# Patient Record
Sex: Female | Born: 1940 | Race: White | Hispanic: No | Marital: Married | Smoking: Never smoker
Health system: Southern US, Community
[De-identification: ages and names within clinical notes are randomized; demographics above are authoritative.]

## PROBLEM LIST (undated history)

## (undated) DIAGNOSIS — H409 Unspecified glaucoma: Secondary | ICD-10-CM

## (undated) DIAGNOSIS — E785 Hyperlipidemia, unspecified: Secondary | ICD-10-CM

## (undated) DIAGNOSIS — I10 Essential (primary) hypertension: Secondary | ICD-10-CM

## (undated) HISTORY — PX: CARPAL TUNNEL RELEASE: SHX101

## (undated) HISTORY — DX: Unspecified glaucoma: H40.9

## (undated) HISTORY — PX: REFRACTIVE SURGERY: SHX103

## (undated) HISTORY — PX: BUNIONECTOMY: SHX129

## (undated) HISTORY — PX: TONSILLECTOMY: SUR1361

## (undated) HISTORY — DX: Hyperlipidemia, unspecified: E78.5

## (undated) HISTORY — DX: Essential (primary) hypertension: I10

## (undated) HISTORY — PX: VARICOSE VEIN SURGERY: SHX832

---

## 1999-07-22 ENCOUNTER — Encounter: Admission: RE | Admit: 1999-07-22 | Discharge: 1999-07-22 | Payer: Self-pay | Admitting: Family Medicine

## 1999-07-22 ENCOUNTER — Encounter: Payer: Self-pay | Admitting: Family Medicine

## 2000-07-24 ENCOUNTER — Encounter: Admission: RE | Admit: 2000-07-24 | Discharge: 2000-07-24 | Payer: Self-pay | Admitting: Family Medicine

## 2000-07-24 ENCOUNTER — Encounter: Payer: Self-pay | Admitting: Family Medicine

## 2001-07-27 ENCOUNTER — Encounter: Payer: Self-pay | Admitting: Family Medicine

## 2001-07-27 ENCOUNTER — Encounter: Admission: RE | Admit: 2001-07-27 | Discharge: 2001-07-27 | Payer: Self-pay | Admitting: Family Medicine

## 2002-07-30 ENCOUNTER — Encounter: Admission: RE | Admit: 2002-07-30 | Discharge: 2002-07-30 | Payer: Self-pay | Admitting: Family Medicine

## 2002-07-30 ENCOUNTER — Encounter: Payer: Self-pay | Admitting: Family Medicine

## 2002-10-16 ENCOUNTER — Ambulatory Visit (HOSPITAL_BASED_OUTPATIENT_CLINIC_OR_DEPARTMENT_OTHER): Admission: RE | Admit: 2002-10-16 | Discharge: 2002-10-16 | Payer: Self-pay | Admitting: *Deleted

## 2002-12-18 ENCOUNTER — Ambulatory Visit (HOSPITAL_BASED_OUTPATIENT_CLINIC_OR_DEPARTMENT_OTHER): Admission: RE | Admit: 2002-12-18 | Discharge: 2002-12-18 | Payer: Self-pay | Admitting: *Deleted

## 2003-08-12 ENCOUNTER — Encounter: Admission: RE | Admit: 2003-08-12 | Discharge: 2003-08-12 | Payer: Self-pay | Admitting: Family Medicine

## 2003-09-11 ENCOUNTER — Ambulatory Visit (HOSPITAL_COMMUNITY): Admission: RE | Admit: 2003-09-11 | Discharge: 2003-09-11 | Payer: Self-pay | Admitting: Orthopedic Surgery

## 2003-09-11 ENCOUNTER — Ambulatory Visit (HOSPITAL_BASED_OUTPATIENT_CLINIC_OR_DEPARTMENT_OTHER): Admission: RE | Admit: 2003-09-11 | Discharge: 2003-09-11 | Payer: Self-pay | Admitting: Orthopedic Surgery

## 2004-08-12 ENCOUNTER — Encounter: Admission: RE | Admit: 2004-08-12 | Discharge: 2004-08-12 | Payer: Self-pay | Admitting: Family Medicine

## 2005-05-31 ENCOUNTER — Ambulatory Visit: Payer: Self-pay | Admitting: Internal Medicine

## 2005-06-22 ENCOUNTER — Ambulatory Visit: Payer: Self-pay | Admitting: Internal Medicine

## 2005-08-15 ENCOUNTER — Encounter: Admission: RE | Admit: 2005-08-15 | Discharge: 2005-08-15 | Payer: Self-pay | Admitting: Family Medicine

## 2005-09-29 ENCOUNTER — Encounter: Admission: RE | Admit: 2005-09-29 | Discharge: 2005-09-29 | Payer: Self-pay | Admitting: Family Medicine

## 2006-08-18 ENCOUNTER — Encounter: Admission: RE | Admit: 2006-08-18 | Discharge: 2006-08-18 | Payer: Self-pay | Admitting: Family Medicine

## 2006-08-29 ENCOUNTER — Encounter: Admission: RE | Admit: 2006-08-29 | Discharge: 2006-08-29 | Payer: Self-pay | Admitting: Family Medicine

## 2007-08-20 ENCOUNTER — Encounter: Admission: RE | Admit: 2007-08-20 | Discharge: 2007-08-20 | Payer: Self-pay | Admitting: Family Medicine

## 2008-04-22 ENCOUNTER — Encounter: Admission: RE | Admit: 2008-04-22 | Discharge: 2008-04-22 | Payer: Self-pay | Admitting: Family Medicine

## 2008-07-22 ENCOUNTER — Encounter: Admission: RE | Admit: 2008-07-22 | Discharge: 2008-07-22 | Payer: Self-pay | Admitting: Interventional Radiology

## 2008-08-26 ENCOUNTER — Encounter: Admission: RE | Admit: 2008-08-26 | Discharge: 2008-08-26 | Payer: Self-pay | Admitting: Internal Medicine

## 2008-09-10 ENCOUNTER — Encounter: Admission: RE | Admit: 2008-09-10 | Discharge: 2008-09-10 | Payer: Self-pay | Admitting: Interventional Radiology

## 2008-09-17 ENCOUNTER — Encounter: Admission: RE | Admit: 2008-09-17 | Discharge: 2008-09-17 | Payer: Self-pay | Admitting: Interventional Radiology

## 2008-10-07 ENCOUNTER — Encounter: Payer: Self-pay | Admitting: Interventional Radiology

## 2009-03-04 ENCOUNTER — Encounter: Admission: RE | Admit: 2009-03-04 | Discharge: 2009-03-04 | Payer: Self-pay | Admitting: Interventional Radiology

## 2009-08-28 ENCOUNTER — Encounter: Admission: RE | Admit: 2009-08-28 | Discharge: 2009-08-28 | Payer: Self-pay | Admitting: Internal Medicine

## 2010-06-01 ENCOUNTER — Encounter: Payer: Self-pay | Admitting: Internal Medicine

## 2010-06-10 ENCOUNTER — Encounter (INDEPENDENT_AMBULATORY_CARE_PROVIDER_SITE_OTHER): Payer: Self-pay | Admitting: *Deleted

## 2010-06-13 ENCOUNTER — Encounter: Payer: Self-pay | Admitting: Family Medicine

## 2010-06-24 NOTE — Letter (Signed)
Summary: Pre Visit Letter Revised  [facility]  [address] [place], [place] [address]   Phone: [phone]  Fax: [phone]        06/10/2010 MRN: [id number] Sheena Davis [address] [place], [place]  [address]             Procedure Date:  07-27-10   Welcome to the Gastroenterology Division at [facility].    You are scheduled to see a nurse for your pre-procedure visit on 07-13-10 at 10:30a.m. on the 3rd floor at [facility], [address]  We ask that you try to arrive at our office 15 minutes prior to your appointment time to allow for check-in.  Please take a minute to review the attached form.  If you answer "Yes" to one or more of the questions on the first page, we ask that you call the person listed at your earliest opportunity.  If you answer "No" to all of the questions, please complete the rest of the form and bring it to your appointment.    Your nurse visit will consist of discussing your medical and surgical history, your immediate family medical history, and your medications.   If you are unable to list all of your medications on the form, please bring the medication bottles to your appointment and we will list them.  We will need to be aware of both prescribed and over the counter drugs.  We will need to know exact dosage information as well.    Please be prepared to read and sign documents such as consent forms, a financial agreement, and acknowledgement forms.  If necessary, and with your consent, a friend or relative is welcome to sit-in on the nurse visit with you.  Please bring your insurance card so that we may make a copy of it.  If your insurance requires a referral to see a specialist, please bring your referral form from your primary care physician.  No co-pay is required for this nurse visit.     If you cannot keep your appointment, please call [phone] to cancel or reschedule prior to your appointment date.   This allows [place] the opportunity to schedule an appointment for another patient in need of care.    Thank you for choosing [facility] for your medical needs.  We appreciate the opportunity to care for you.  Please visit [place] at our website  to learn more about our practice.  Sincerely, The Gastroenterology Division

## 2010-06-24 NOTE — Letter (Signed)
Summary: Colonoscopy Letter  [facility]  [address] [place], [place] [address]   Phone: [phone]  Fax: [phone]      June 01, 2010 MRN: [id number]   [facility] [address] [place], [place]  [address]   Dear Ms. Cefalu,   According to your medical record, it is time for you to schedule a Colonoscopy. The American Cancer Society recommends this procedure as a method to detect early colon cancer. Patients with a family history of colon cancer, or a personal history of colon polyps or inflammatory bowel disease are at increased risk.  This letter has been generated based on the recommendations made at the time of your procedure. If you feel that in your particular situation this may no longer apply, please contact our office.  Please call our office at [phone] to schedule this appointment or to update your records at your earliest convenience.  Thank you for cooperating with [place] to provide you with the very best care possible.   Sincerely,  Hedwig Morton. Juanda Chance, M.D.  [facility] Gastroenterology Division [phone]

## 2010-07-12 ENCOUNTER — Encounter (INDEPENDENT_AMBULATORY_CARE_PROVIDER_SITE_OTHER): Payer: Self-pay

## 2010-07-13 ENCOUNTER — Encounter: Payer: Self-pay | Admitting: Internal Medicine

## 2010-07-20 NOTE — Miscellaneous (Signed)
Summary: Lec previsit  Clinical Lists Changes  Allergies: Added new allergy or adverse reaction of ASPIRIN Added new allergy or adverse reaction of AMPICILLIN Observations: Added new observation of NKA: F (07/13/2010 10:06)  Appended Document: Lec previsit I called into [facility] Miralax 255gms,  Ducolax #4 and Reglan 10mg  #2, no refills.Ulis Rias RN 07/13/2010 11:30pm

## 2010-07-20 NOTE — Letter (Signed)
Summary: [facility] Instructions  [facility]  [address] [place], [place] [address]   Phone: [phone]  Fax: [phone]       DONICE ALPERIN    Aug 25, 1940    MRN: [id number]       Procedure Day /Date:  Tuesday 07/27/2010     Arrival Time: 9:00 am     Procedure Time: 10:00 am     Location of Procedure:                    _x _  [facility] (4th Floor)    PREPARATION FOR COLONOSCOPY WITH MIRALAX  Starting 5 days prior to your procedure Thursday 3/1 do not eat nuts, seeds, popcorn, corn, beans, peas,  salads, or any raw vegetables.  Do not take any fiber supplements (e.g. Metamucil, Citrucel, and Benefiber). ____________________________________________________________________________________________________   THE DAY BEFORE YOUR PROCEDURE         DATE: _  _ DAY: _  _  1   Drink clear liquids the entire day-NO SOLID FOOD  2   Do not drink anything colored red or purple.  Avoid juices with pulp.  No orange juice.  3   Drink at least 64 oz. (8 glasses) of fluid/clear liquids during the day to prevent dehydration and help the prep work efficiently.  CLEAR LIQUIDS INCLUDE: Water Jello Ice Popsicles Tea (sugar ok, no milk/cream) Powdered fruit flavored drinks Coffee (sugar ok, no milk/cream) Gatorade Juice: apple, white grape, white cranberry  Lemonade Clear bullion, consomm, broth Carbonated beverages (any kind) Strained chicken noodle soup Hard Candy  4   Mix the entire bottle of Miralax with 64 oz. of Gatorade/Powerade in the morning and put in the refrigerator to chill.  5   At 3:00 pm take 2 Dulcolax/Bisacodyl tablets.  6   At 4:30 pm take one Reglan/Metoclopramide tablet.  7  Starting at 5:00 pm drink one 8 oz glass of the Miralax mixture every 15-20 minutes until you have finished drinking the entire 64 oz.  You should finish drinking prep around 7:30 or 8:00 pm.  8   If you are nauseated, you may take the 2nd Reglan/Metoclopramide  tablet at 6:30 pm.        9    At 8:00 pm take 2 more DULCOLAX/Bisacodyl tablets.     THE DAY OF YOUR PROCEDURE      DATE:  Tuesday 3/6  You may drink clear liquids until 8:00 am (2 HOURS BEFORE PROCEDURE).   MEDICATION INSTRUCTIONS  Unless otherwise instructed, you should take regular prescription medications with a small sip of water as early as possible the morning of your procedure..             Additional medication instructions: Do not take Chlorthalidone am of procedure.         OTHER INSTRUCTIONS  You will need a responsible adult at least 71 years of age to accompany you and drive you home.   This person must remain in the waiting room during your procedure.  Wear loose fitting clothing that is easily removed.  Leave jewelry and other valuables at home.  However, you may wish to bring a book to read or an iPod/MP3 player to listen to music as you wait for your procedure to start.  Remove all body piercing jewelry and leave at home.  Total time from sign-in until discharge is approximately 2-3 hours.  You should go home directly after your procedure and rest.  You can resume normal activities the day after your procedure.  The day of your procedure you should not:   Drive   Make legal decisions   Operate machinery   Drink alcohol   Return to work  You will receive specific instructions about eating, activities and medications before you leave.   The above instructions have been reviewed and explained to me by   Ulis Rias RN  July 13, 2010 11:08 AM     I fully understand and can verbalize these instructions _____________________________ Date _______

## 2010-07-27 ENCOUNTER — Other Ambulatory Visit (AMBULATORY_SURGERY_CENTER): Payer: Medicare Other | Admitting: Internal Medicine

## 2010-07-27 ENCOUNTER — Other Ambulatory Visit: Payer: Self-pay | Admitting: Internal Medicine

## 2010-07-27 DIAGNOSIS — K5730 Diverticulosis of large intestine without perforation or abscess without bleeding: Secondary | ICD-10-CM

## 2010-07-27 DIAGNOSIS — D126 Benign neoplasm of colon, unspecified: Secondary | ICD-10-CM

## 2010-07-27 DIAGNOSIS — Z800 Family history of malignant neoplasm of digestive organs: Secondary | ICD-10-CM

## 2010-07-27 DIAGNOSIS — Z1211 Encounter for screening for malignant neoplasm of colon: Secondary | ICD-10-CM

## 2010-08-02 ENCOUNTER — Encounter: Payer: Self-pay | Admitting: Internal Medicine

## 2010-08-03 NOTE — Procedures (Addendum)
Summary: Colonoscopy  Patient: Sheena Davis Note: All result statuses are Final unless otherwise noted.  Tests: (1) Colonoscopy (COL)   COL Colonoscopy           DONE     [facility]     [address]     [place], [place]  [address]          COLONOSCOPY PROCEDURE REPORT          PATIENT:  [name], [name]  MR#:  [id number]     BIRTHDATE:  1941-01-26, 69 yrs. old  GENDER:  female     ENDOSCOPIST:  Hedwig Morton. Juanda Chance, MD     REF. BY:  Kirby Funk, M.D.     PROCEDURE DATE:  07/27/2010     PROCEDURE:  Colonoscopy 19147     ASA CLASS:  Class I     INDICATIONS:  Elevated Risk Screening sister with colon cancer     normal colon 2007     MEDICATIONS:   Versed 10 mg, Fentanyl 100 mcg          DESCRIPTION OF PROCEDURE:   After the risks benefits and     alternatives of the procedure were thoroughly explained, informed     consent was obtained.  Digital rectal exam was performed and     revealed no rectal masses.   The LB [id number]-[id number] endoscope     was introduced through the anus and advanced to the cecum, which     was identified by both the appendix and ileocecal valve, without     limitations.  The quality of the prep was good, using MiraLax.     The instrument was then slowly withdrawn as the colon was fully     examined.     <<PROCEDUREIMAGES>>          FINDINGS:  A diminutive polyp was found in the right colon. at 130     cm 2 mm polyp The polyp was removed using cold biopsy forceps (see     image4).  Mild diverticulosis was found (see image1).  This was     otherwise a normal examination of the colon (see image2, image3,     and image5).   Retroflexed views in the rectum revealed no     abnormalities.    The scope was then withdrawn from the patient     and the procedure completed.          COMPLICATIONS:  None     ENDOSCOPIC IMPRESSION:     1) Diminutive polyp in the right colon     2) Mild diverticulosis     3) Otherwise normal examination  RECOMMENDATIONS:     1) Await pathology results     2) High fiber diet.     REPEAT EXAM:  In 5 year(s) for.  consider Propofol for recall     colon          ______________________________     Hedwig Morton. Juanda Chance, MD          CC:          n.     eSIGNED:   Hedwig Morton. Meg Niemeier at 07/27/2010 11:01 AM          Duch, Janelle, [phone]  Note: An exclamation mark (!) indicates a result that was not dispersed into the flowsheet. Document Creation Date: 07/27/2010 11:01 AM _______________________________________________________________________  (1) Order result status: Final Collection or observation date-time: 07/27/2010 10:51 Requested date-time:  Receipt date-time:  Reported date-time:  Referring Physician:   Ordering Physician: Lina Sar [phone]) Specimen Source:  Source: Launa Grill Order Number: [id number] Lab site:   Appended Document: Colonoscopy     Procedures Next Due Date:    Colonoscopy: 07/2015

## 2010-08-10 NOTE — Letter (Signed)
Summary: Patient Notice- Polyp Results  [facility]  [address] [place], [place] [address]   Phone: [phone]  Fax: [phone]        August 02, 2010 MRN: [id number]    [facility] [address] [place], [place]  [address]    Dear Ms. Byrom,  I am pleased to inform you that the colon polyp(s) removed during your recent colonoscopy was (were) found to be benign (no cancer detected) upon pathologic examination.The polyp was adenomatous ( precancerous)  I recommend you have a repeat colonoscopy examination in _5 years to look for recurrent polyps, as having colon polyps increases your risk for having recurrent polyps or even colon cancer in the future.  Should you develop new or worsening symptoms of abdominal pain, bowel habit changes or bleeding from the rectum or bowels, please schedule an evaluation with either your primary care physician or with me.  Additional information/recommendations: x __ No further action with gastroenterology is needed at this time. Please      follow-up with your primary care physician for your other healthcare      needs.  __ Please call [phone] to schedule a return visit to review your      situation.  __ Please keep your follow-up visit as already scheduled.  __ Continue treatment plan as outlined the day of your exam.  Please call us if you are having persistent problems or have questions about your condition that have not been fully answered at this time.  Sincerely,  Hart Carwin MD  This letter has been electronically signed by your physician.  Appended Document: Patient Notice- Polyp Results letter mailed

## 2010-08-12 ENCOUNTER — Other Ambulatory Visit: Payer: Self-pay | Admitting: Internal Medicine

## 2010-08-12 DIAGNOSIS — Z1231 Encounter for screening mammogram for malignant neoplasm of breast: Secondary | ICD-10-CM

## 2010-08-31 ENCOUNTER — Ambulatory Visit
Admission: RE | Admit: 2010-08-31 | Discharge: 2010-08-31 | Disposition: A | Discharge status: Home / Self Care | Payer: Medicare Other | Source: Ambulatory Visit | Attending: Internal Medicine | Admitting: Internal Medicine

## 2010-08-31 DIAGNOSIS — Z1231 Encounter for screening mammogram for malignant neoplasm of breast: Secondary | ICD-10-CM

## 2010-10-08 NOTE — Op Note (Signed)
   NAME:  Sheena Davis, Sheena Davis                          ACCOUNT NO.:  [id number]   MEDICAL RECORD NO.:  [id number]                   PATIENT TYPE:  AMB   LOCATION:  DSC                                  FACILITY:  [facility]   PHYSICIAN:  Lowell Bouton, M.D.      DATE OF BIRTH:  [birth year]   DATE OF PROCEDURE:  12/18/2002  DATE OF DISCHARGE:                                 OPERATIVE REPORT   PREOPERATIVE DIAGNOSIS:  Right carpal tunnel syndrome.   POSTOPERATIVE DIAGNOSIS:  Right carpal tunnel syndrome.   OPERATION PERFORMED:  Decompression of median nerve, right carpal tunnel.   SURGEON:  Lowell Bouton, M.D.   ANESTHESIA:  0.5% Marcaine local with sedation.   OPERATIVE FINDINGS:  The patient had no masses in the carpal canal and the  motor branch of the nerve was intact.   DESCRIPTION OF PROCEDURE:  Under 0.5% Marcaine local anesthesia with a  tourniquet on the right arm, the right hand was prepped and draped in the  usual fashion and after exsanguinating the limb, the tourniquet was inflated  to 250 mmHg.  A 3 cm longitudinal incision was made in the palm just ulnar  to the thenar crease.  Sharp dissection was carried through the subcutaneous  tissues and bleeding points were coagulated.  Blunt dissection was carried  through the superficial palmar fascia distal to the transverse carpal  ligament.  A hemostat was placed in the carpal canal up against the hook of  the hamate and the transverse carpal ligament was divided on the ulnar  border of the median nerve.  The proximal end of the ligament was divided  with the scissors after dissecting the nerve away from the undersurface of  the ligament.  The carpal canal was then palpated and was found to be  adequately decompressed.  The nerve was examined and the motor branch  identified.  The wound was then irrigated with saline.  The skin was closed  with 4-0 nylon suture.  Sterile dressings were applied followed by a  volar  wrist splint.  The patient tolerated the procedure well and went to the  recovery room awake and stable in  good condition.                                                  Lowell Bouton, M.D.    EMM/MEDQ  D:  12/18/2002  T:  12/18/2002  Job:  [id number]

## 2010-10-08 NOTE — Op Note (Signed)
NAME:  Sheena Davis, Sheena Davis                          ACCOUNT NO.:  [id number]   MEDICAL RECORD NO.:  [id number]                   PATIENT TYPE:  AMB   LOCATION:  DSC                                  FACILITY:  [facility]   PHYSICIAN:  Nadara Mustard, M.D.                DATE OF BIRTH:  Aug 26, 1940   DATE OF PROCEDURE:  09/11/2003  DATE OF DISCHARGE:                                 OPERATIVE REPORT   PREOPERATIVE DIAGNOSIS:  Hallus valgus deformity, right great toe.   POSTOPERATIVE DIAGNOSIS:  Hallux valgus deformity, right great toe.   PROCEDURES:  1. Right great toe first metatarsal chevron osteotomy.  2. Right great toe proximal phalanx Akin osteotomy.  3. Partial excision, right great toenail.   SURGEON:  Nadara Mustard, M.D.   ANESTHESIA:  Popliteal block.   ESTIMATED BLOOD LOSS:  Minimal.   ANTIBIOTICS:  1 g Kefzol.   TOURNIQUET TIME:  Esmarch at the ankle for approximately 30 minutes.   DISPOSITION:  To PACU in stable condition.   Planned for discharge to home, nonweightbearing on the right, ice and  elevation to the right lower extremity.  Follow-up in the office in one  week.   INDICATION FOR PROCEDURE:  The patient is a 70 year old woman with a painful  hallux valgus deformity of the right great toe.  The patient has failed  conservative care and presents at this time for surgical intervention.  The  patient also complains at this time of a painful ingrown toenail, and she  states she has had a previous infection in the past and it was planned  preoperatively to go ahead and do a partial excision of the medial and  lateral borders of the right great toenail to prevent a risk of a  paronychia.  The patient received 1 g of Kefzol preoperatively.  The risks  and benefits of surgery were discussed, including infection, neurovascular  injury, persistent pain, recurrence of the deformity, fracture of the bone,  need for additional surgery.  The patient states she understands and  wishes  to proceed at this time.   DESCRIPTION OF PROCEDURE:  The patient was brought to OR room 5 after  undergoing a popliteal block.  After adequate levels of anesthesia obtained,  the patient's right lower extremity was prepped using Duraprep and draped  into a sterile field.  The leg was elevated.  An Esmarch was wrapped around  the ankle for tourniquet control.  A longitudinal incision was made over the  medial border of the right great toe.  A football shape of the retinaculum  around the great toe MTP joint was ellipsed out to reduce the sesamoids.  An  ostectomy was performed off the medial border of the first metatarsal head.  A chevron osteotomy was then performed and the metatarsal head was  transferred laterally approximately 4 mm.  This was then held stabilized  with  a 0.0625 K-wire from proximal dorsal to distal plantar.  The remainder  of ostectomy was then performed.  The patient still had significant valgus  deformity of the great toe, and an Akin osteotomy was performed at the base  of the proximal phalanx.  This was held stabilized with a [place] staple.  The patient had a good alignment of the toe and had good, stable range of  motion of the great toe MTP joint.  The wound was irrigated.  The  retinaculum was closed using 2-0 Vicryl interrupted.  The skin was closed  using a 3-0 nylon with a vertical mattress suture.  Attention was then  focused to the great toe.  The medial and lateral borders of the right great  toenail were then excised without complications.  There was no evidence of  paronychia, no evidence of infection at this time.  The wounds were then  covered with Adaptic orthopedic sponges, sterile Webril, and a Coban  dressing was applied.  The patient was then taken to PACU in stable  condition and planned to follow up in the office in one week.                                               Nadara Mustard, M.D.    MVD/MEDQ  D:  09/11/2003  T:   09/12/2003  Job:  [id number]

## 2010-10-08 NOTE — Op Note (Signed)
   NAME:  Sheena Davis, Sheena Davis                          ACCOUNT NO.:  [id number]   MEDICAL RECORD NO.:  [id number]                   PATIENT TYPE:  AMB   LOCATION:  DSC                                  FACILITY:  [facility]   PHYSICIAN:  Lowell Bouton, M.D.      DATE OF BIRTH:  Jan 24, 1941   DATE OF PROCEDURE:  10/16/2002  DATE OF DISCHARGE:                                 OPERATIVE REPORT   PREOPERATIVE DIAGNOSIS:  Left carpal tunnel syndrome.   POSTOPERATIVE DIAGNOSIS:  Left carpal tunnel syndrome.   PROCEDURE:  Decompression of median nerve, left carpal tunnel.   SURGEON:  Lowell Bouton, M.D.   ANESTHESIA:  Marcaine 0.5% local with sedation.   FINDINGS:  The patient had a very narrow carpal canal.  There was  significant pressure on the nerve and the motor branch was intact.   DESCRIPTION OF PROCEDURE:  Under 0.5% Marcaine local anesthesia with the  tourniquet on the left arm, the left hand was prepped and draped in the  usual fashion.  After exsanguinating the limb, the tourniquet was inflated  to 250 mmHg.  A 3.0 cm longitudinal incision was made in the palm just ulnar  to the thenar crease.  Sharp dissection was carried through the subcutaneous  tissues, and bleeding points were coagulated.  Blunt dissection was carried  through the superficial palmar fascia distal to the transverse carpal  ligament.  A hemostat was then placed up against the hook of the hamate.  The transverse carpal ligament was divided on the ulnar border of the median  nerve.  The proximal end of the ligament was divided with the scissors,  after dissecting the nerve away from the under-surface of the ligament.  The  carpal canal was then palpated and was found to be adequately decompressed.  The nerve was examined and the motor branch identified.  The wound was  irrigated with saline.  The skin was closed with #4-0 nylon sutures.  Sterile dressings were applied, followed by a volar wrist  splint.  The patient tolerated the procedure well and went to the recovery room  awake, stable, and in good condition.                                                Lowell Bouton, M.D.    EMM/MEDQ  D:  10/16/2002  T:  10/16/2002  Job:  [id number]   cc:   Mosetta Putt, M.D.  [address] [place]  [place] [address]  Fax: [phone]

## 2010-12-11 IMAGING — US IR TRANSCATH EMBOLIZATION NON-NEURO EA OP FIELD
1 series · 8 of 8 positions shown · non-contrast
Comparison: none

CLINICAL DATA: Left G S V venous insufficiency, varicose veins, leg
pain

[Series 1: ir transcath embolization non-neuro ea op field · 0.08mm/px · 8 acquisitions, 8 frames shown]
[im 1/8]
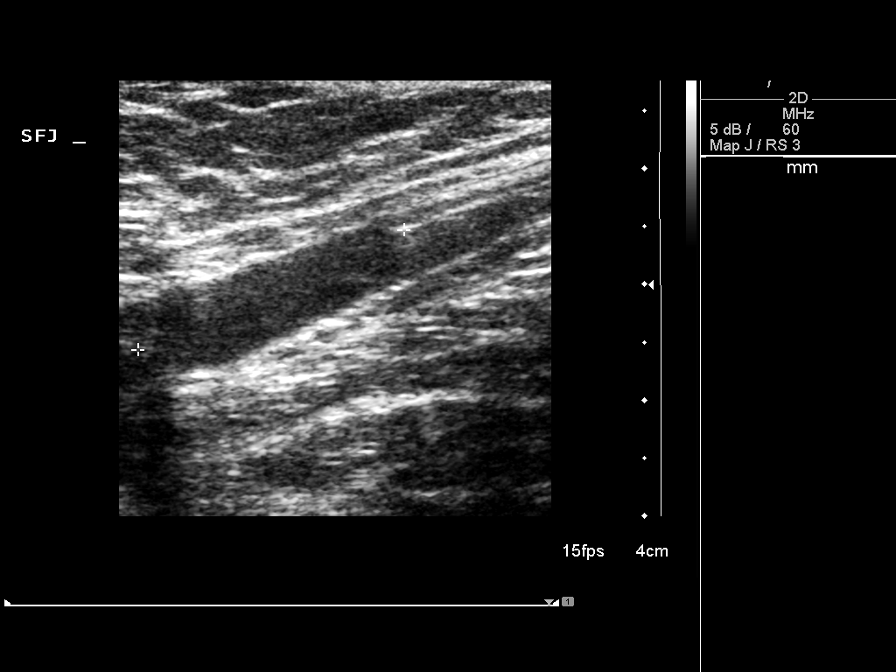
[im 2/8]
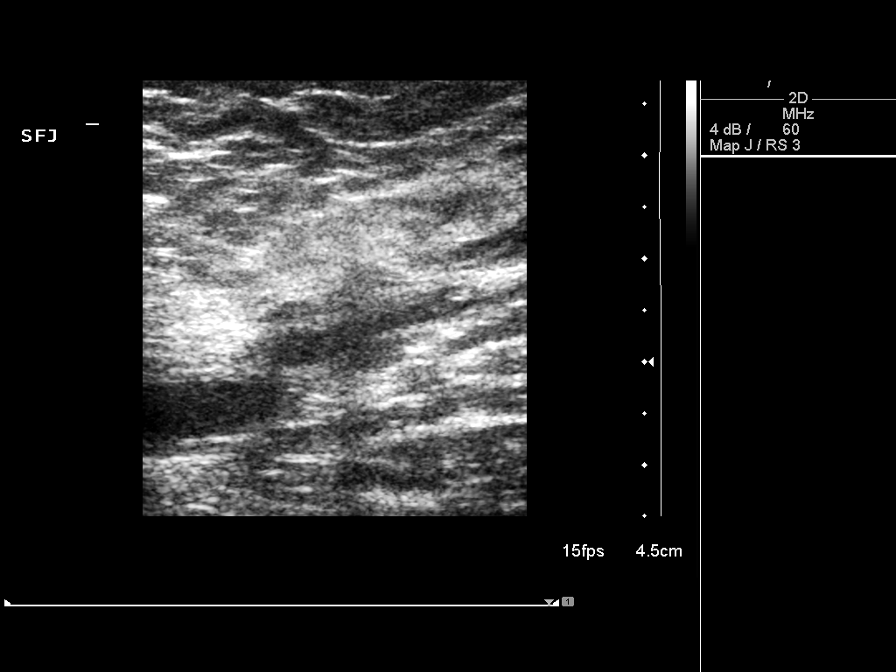
[im 3/8]
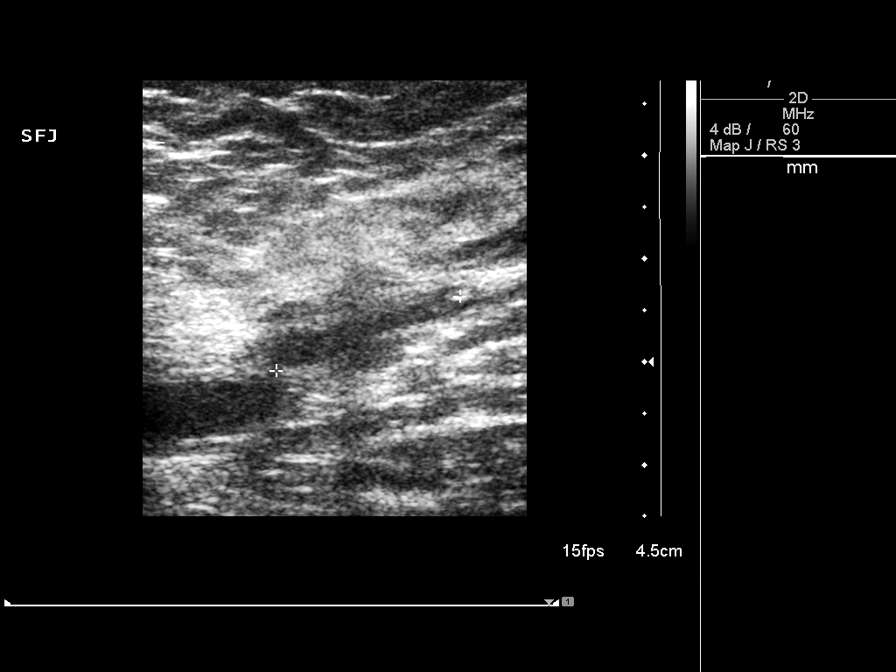
[im 4/8]
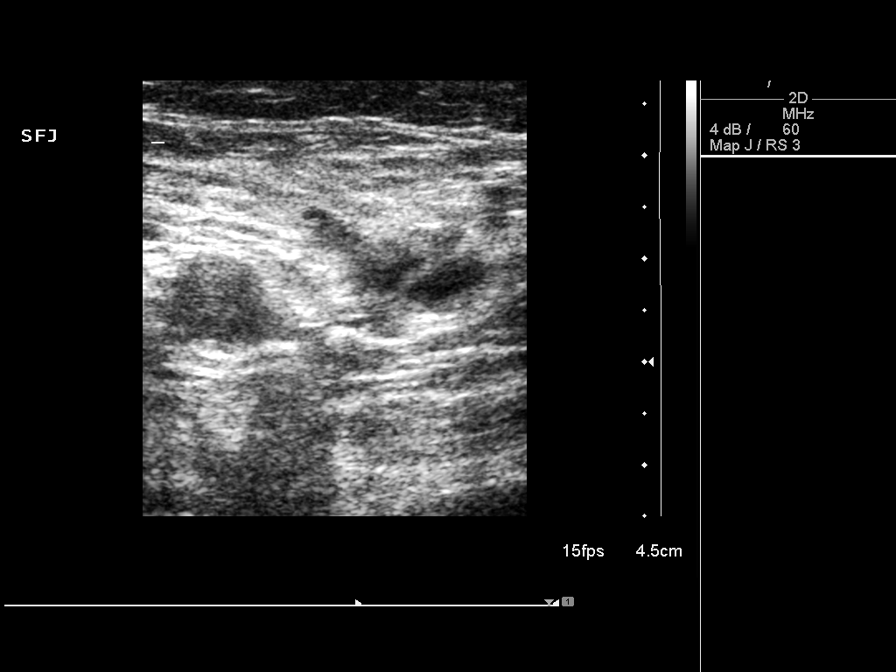
[im 5/8]
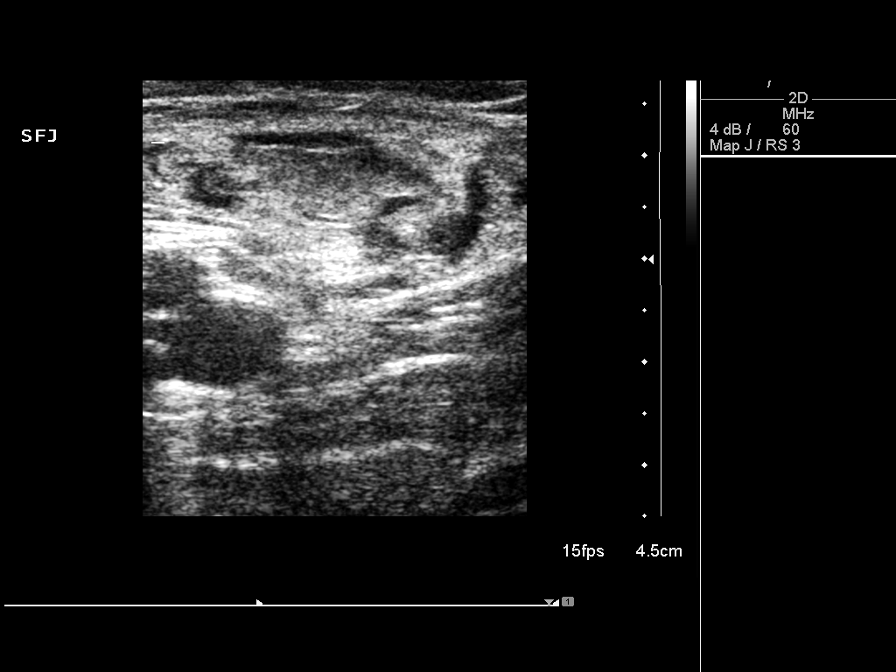
[im 6/8]
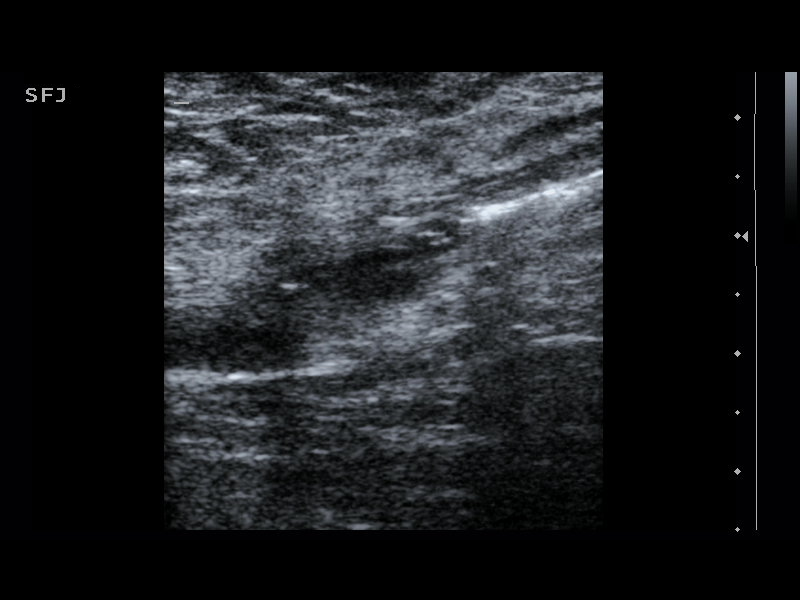
[im 7/8]
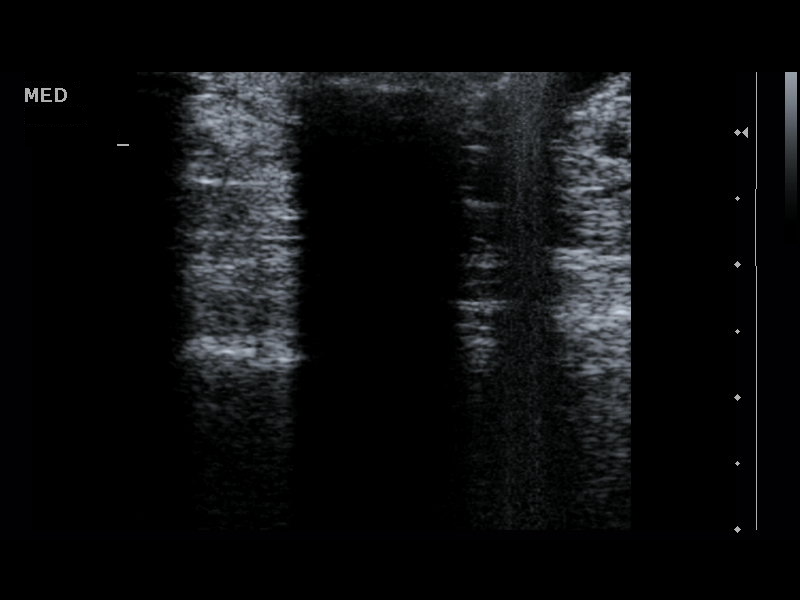
[im 8/8]
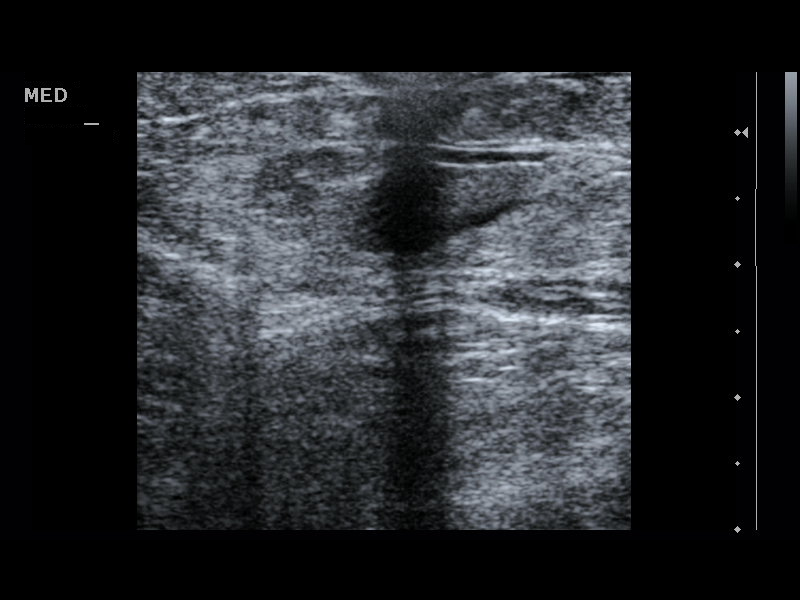

[8 of 8 positions shown; findings below may reference images not displayed]

ULTRASOUND LEFT G S V TRANSCATHETER LASER OCCLUSION
ULTRASOUND VARICOSE VEIN FOAM SCLEROTHERAPY

Date:  09/10/2008 [DATE]

Radiologist:  Olivier Azcona, M.D.

Medications:  400 ml diluted 1% lidocaine for local and tumescent
anesthesia

Guidance:  Ultrasound

Complications:  No immediate

PROCEDURE/FINDINGS:

Informed consent was obtained from the patient following
explanation of the procedure, risks, benefits and alternatives.
The patient understands, agrees and consents for the procedure.
All questions were addressed.  A time out was performed.

Preliminary venous mapping was performed of the left G S V from the
inguinal ligament to the ankle.  Entire left leg was sterilely
prepped and draped.

Under sterile conditions and local anesthesia, distal left G S V
access was performed with ultrasound and a micropuncture needle
just above the ankle.  A guide wire was advanced followed by the
transitional dilator.  A hydrophilic Aqualiner guide wire
eventually advanced through tortuous segments of the left G S V and
was confirmed across the left SFJ proximally.  The 4-French
delivery sheath was advanced over the guide wire and positioned 20
mm inferior to the SFJ.  The laser fiber was advanced through the
sheath but not yet exposed to the native vein wall.

400 ml of dilute 1% lidocaine was infiltrated along the G S V
segment utilizing the tumescent pump delivery set.  There is good
infiltration, collapse and insulation of the surrounding tissues.
Skin depth was 10 mm or more.

The laser fiber was exposed to the native vein wall and reconfirmed
to be 20 mm inferior to the left SF J.  The laser fiber was enabled
at a power setting of 12 Dejoun to deliver 1899 joules over 307
seconds to treat a 60 cm segment of the left G S V.  Hemostasis was
obtained with  compression.  The patient tolerated the procedure
well.  No immediate complication.

Residual patent varicose veins were visualized in the distal medial
thigh and calf.  Injection foam sclerotherapy was performed with
ultrasound.  1 ml of 1% S T S was injected into the medial distal
thigh varicosity under ultrasound with good dispersal throughout
the varicosity.  In the medial proximal calf, 0.5 ml of 1% S T S
was injected into a residual varicosity with good dispersal.  No
immediate complication.

The patient tolerated the procedure well.
IMPRESSION: Successful left G S V transcatheter laser occlusion.
Successful residual medial distal thigh and proximal calf varicose
vein injection foam sclerotherapy

## 2010-12-18 IMAGING — US EM EST PATIENT OFFICE LEVEL 3 (15 MIN)
1 series · 13 of 16 positions shown · non-contrast
Comparison: none

CLINICAL DATA: Left leg venous insufficiency.

[Series 1: em est patient office level 3 (15 min) · 13 of 32 slices shown]
[im 1/32]
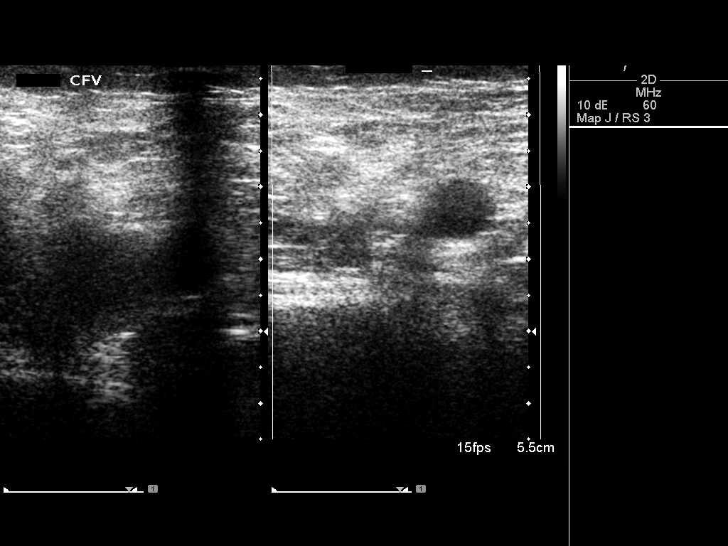
[im 3/32]
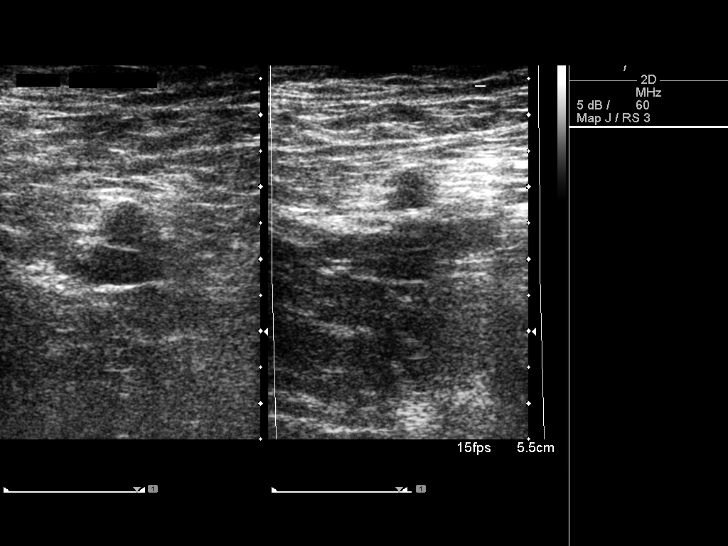
[im 7/32]
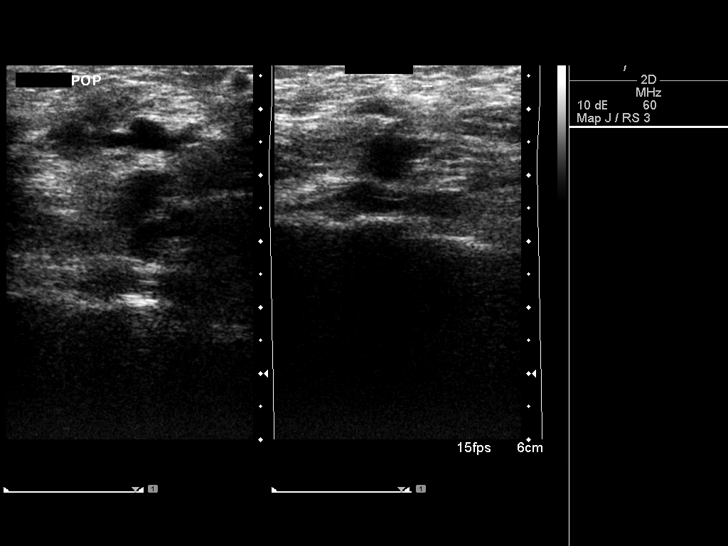
[im 9/32]
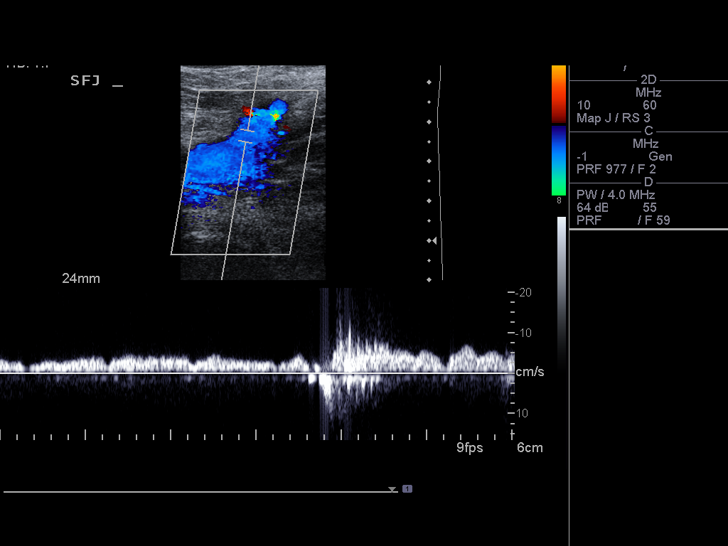
[im 11/32]
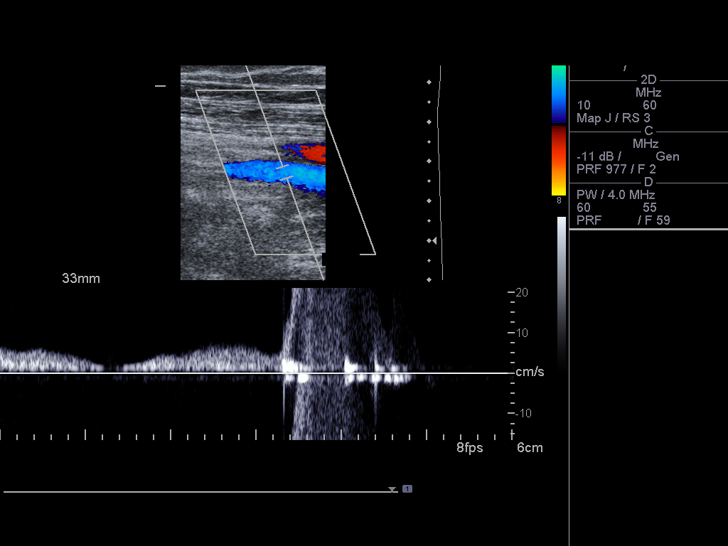
[im 13/32]
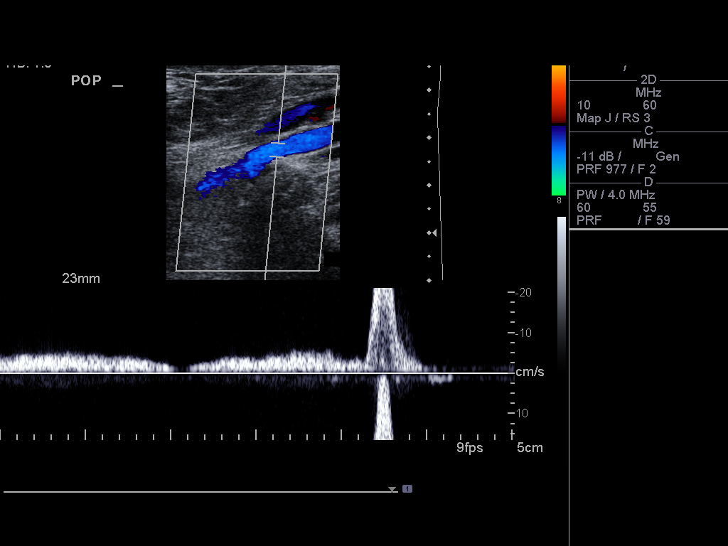
[im 17/32]
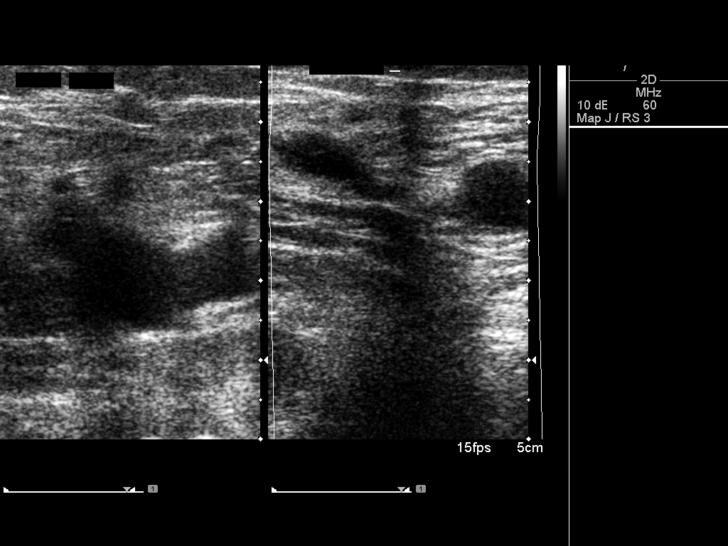
[im 19/32]
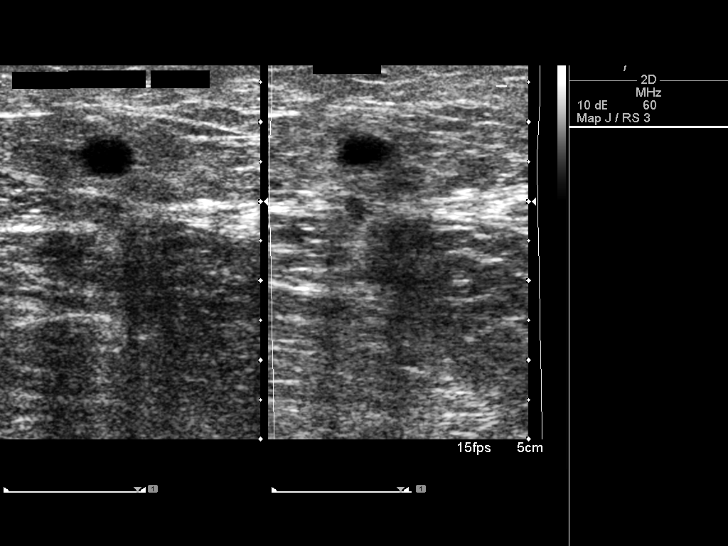
[im 21/32]
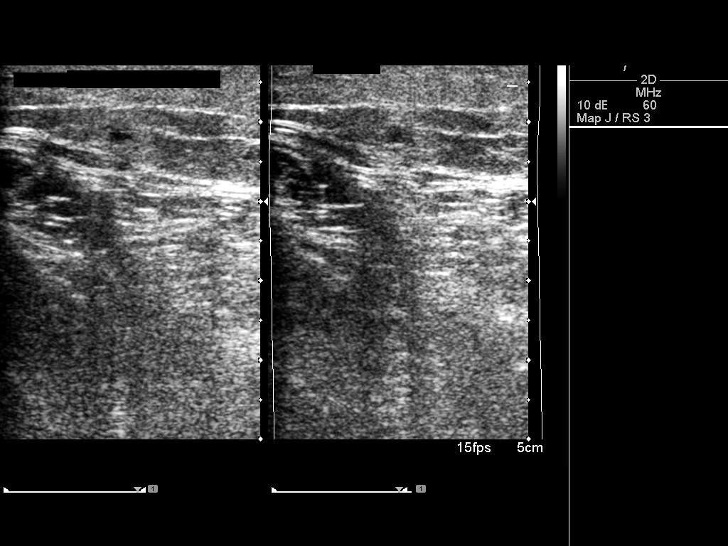
[im 23/32]
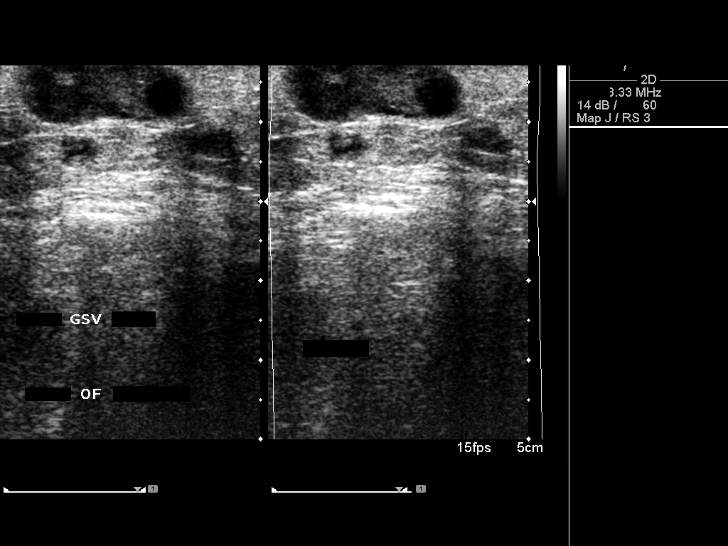
[im 25/32]
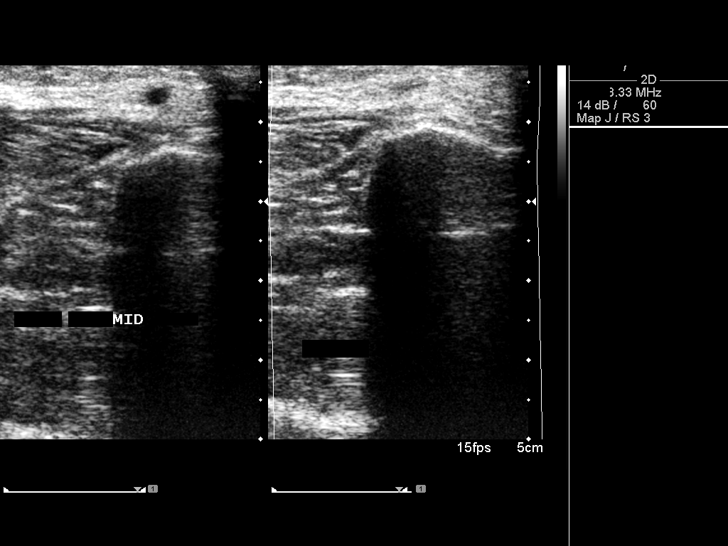
[im 29/32]
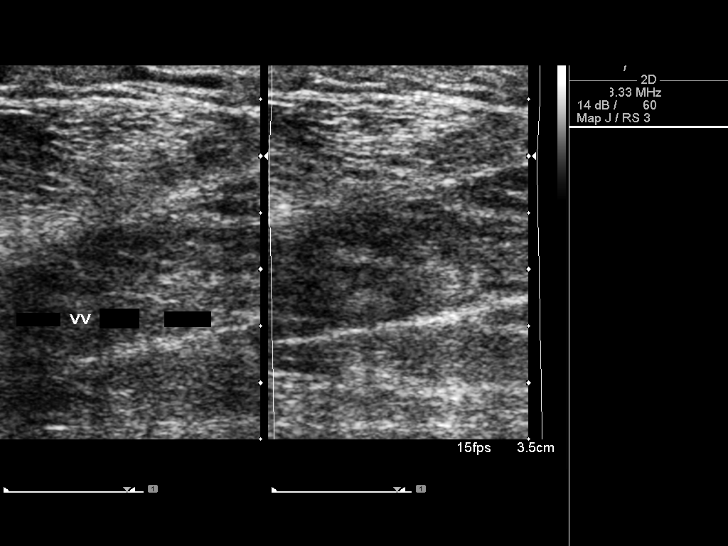
[im 32/32]
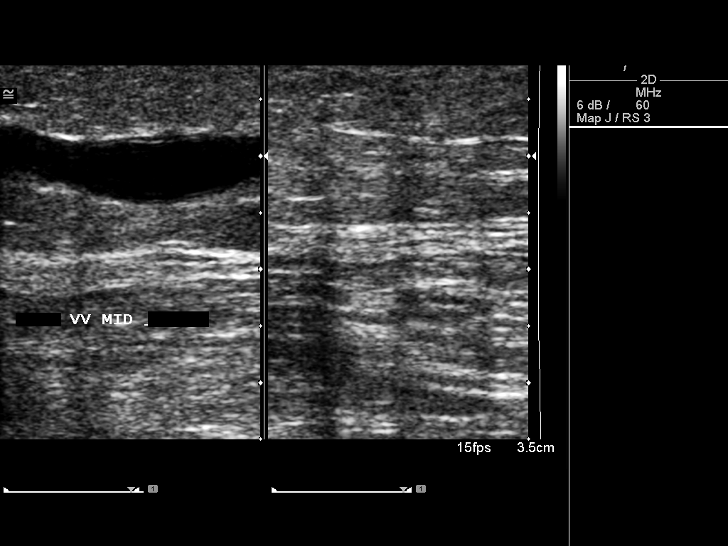

[13 of 16 positions shown; findings below may reference images not displayed]

ESTABLISHED PATIENT OFFICE VISIT - LEVEL 2

Ms. Jitu returns 1 week after endovascular laser therapy of the
left greater saphenous vein and sclerotherapy of associated
varicosities.  She has no complaints other than slight soreness
along the knee at the greater saphenous vein.  She denies any
fevers or chills.  She has been performing light activity and has
not been bicycling.  She has been walking.

On physical exam, the left lower extremity is warm and pink.
Neurologically intact.  There is some bruising along the course of
the greater saphenous vein in the distal thigh.  There is focal
erythema over the greater saphenous vein at the knee but no
fluctuance and no skin breakdown.  No leg edema is present.

Sonography was performed demonstrating no DVT and occlusion of the
greater saphenous vein.  Some of the varicosities are thrombosed
and others remain patent.

In summary, Ms. Jitu has done well 1 week out from her laser
mediated greater saphenous vein occlusion in her left leg.  She was
assured that the symptoms will eventually resolve and was
instructed to take Tylenol symptomatically.  She was also
instructed to maintain light activity until her 1 month follow-up.

[REDACTED]

## 2011-01-07 IMAGING — US US EXTREM LOW VENOUS*L*
1 series · 14 of 24 positions shown · non-contrast
Comparison: 09/17/2008

CLINICAL DATA: 1 month status post left G S V transcatheter laser
occlusion



[Series 1: us extrem low venous*left* · 14 of 32 slices shown]
[im 1/32]
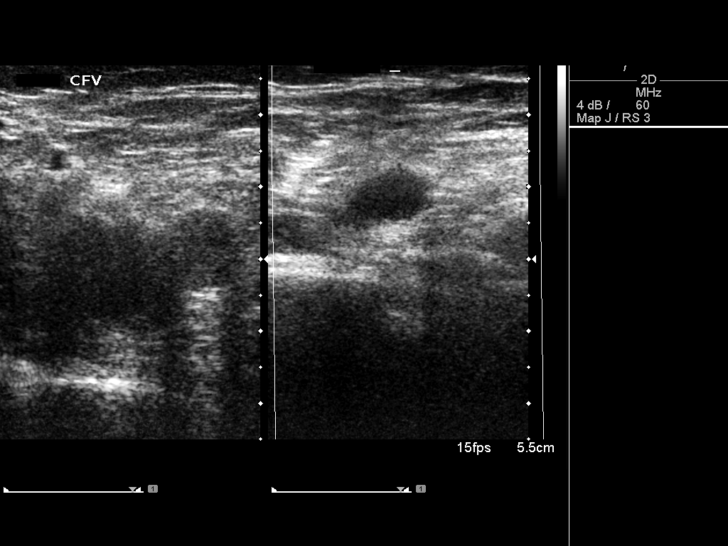
[im 3/32]
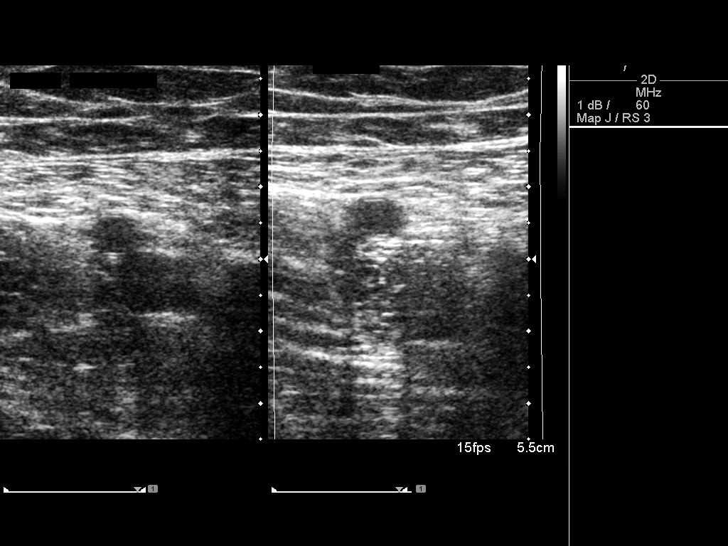
[im 6/32]
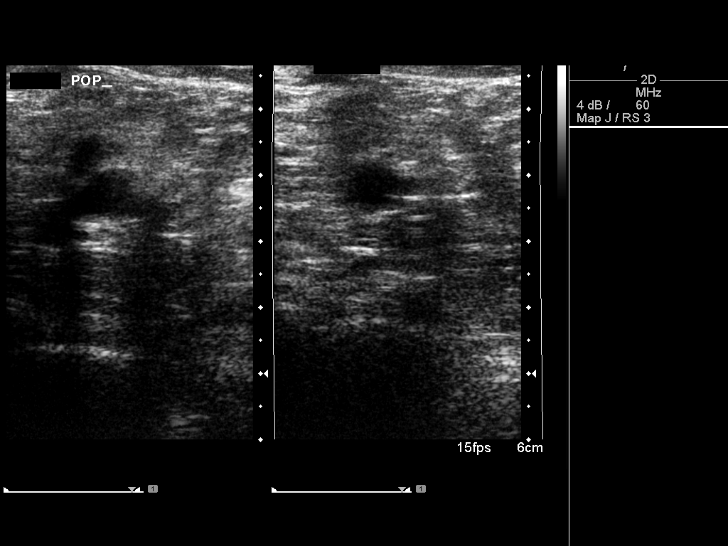
[im 9/32]
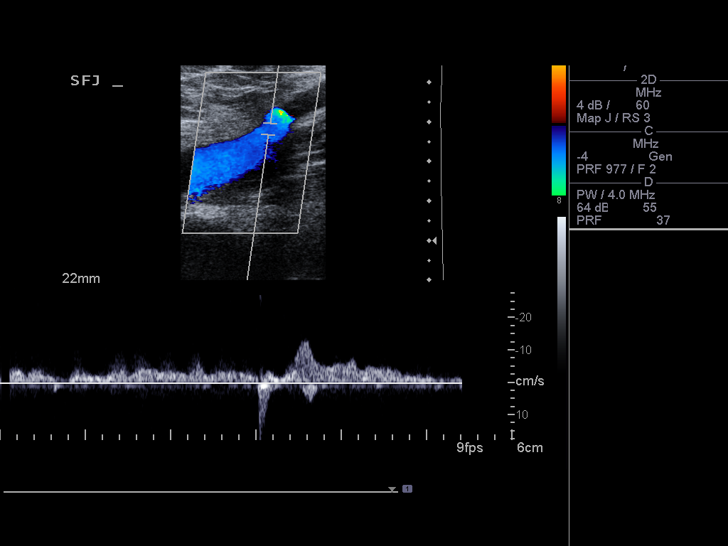
[im 10/32]
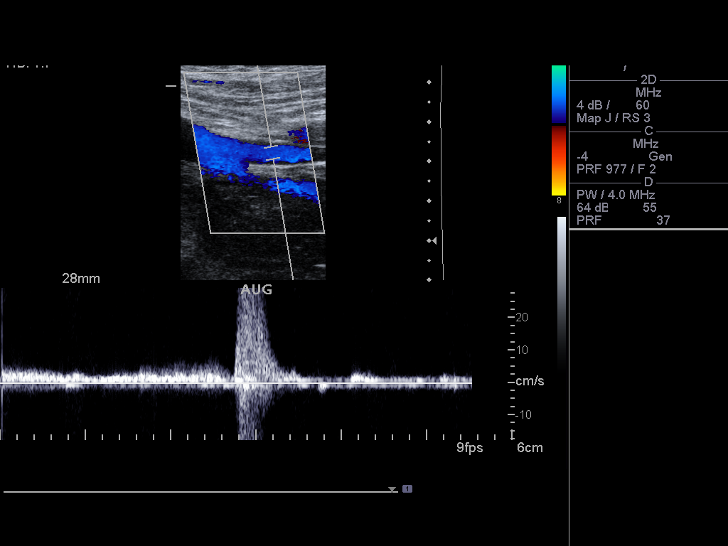
[im 13/32]
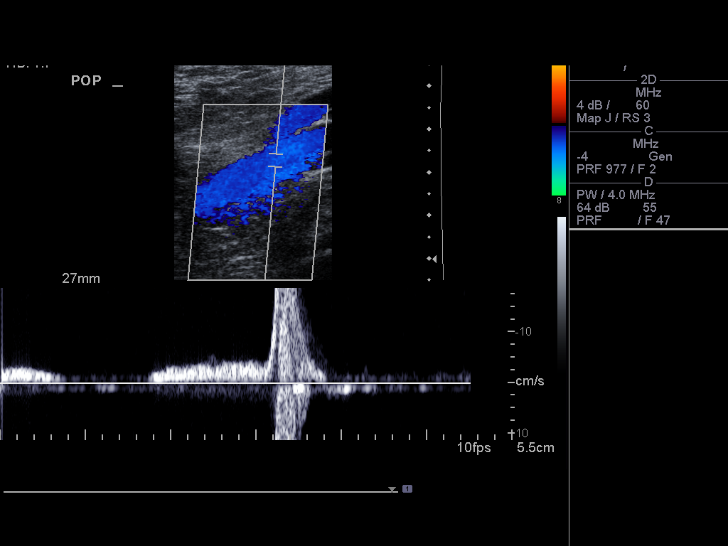
[im 15/32]
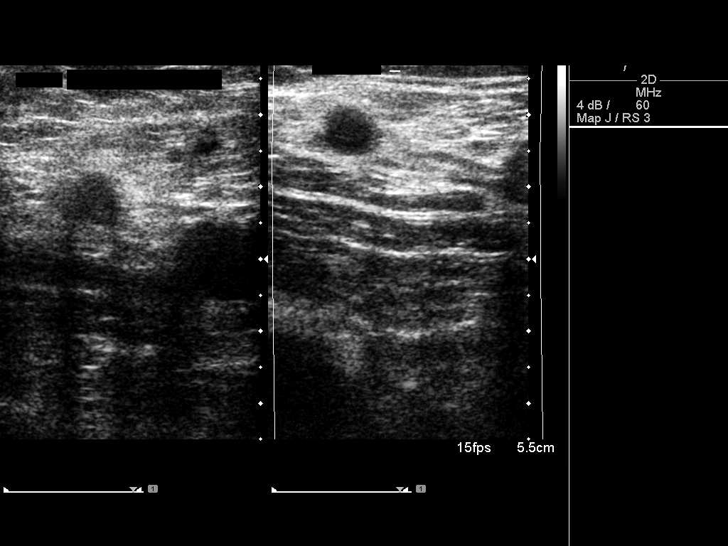
[im 17/32]
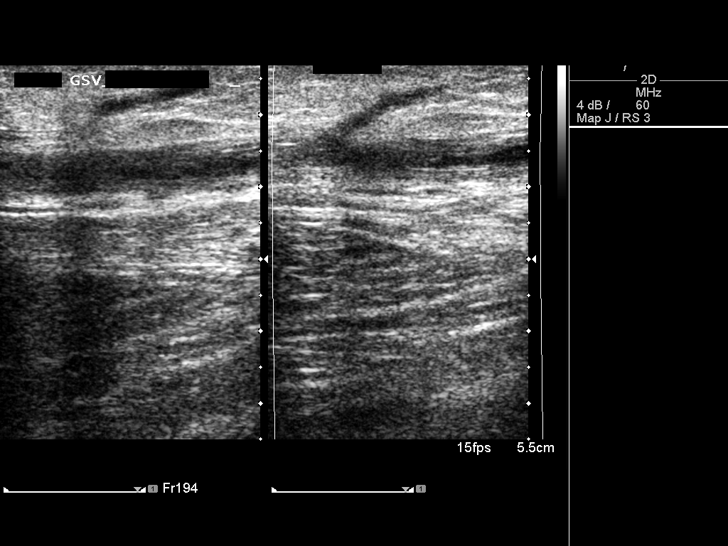
[im 19/32]
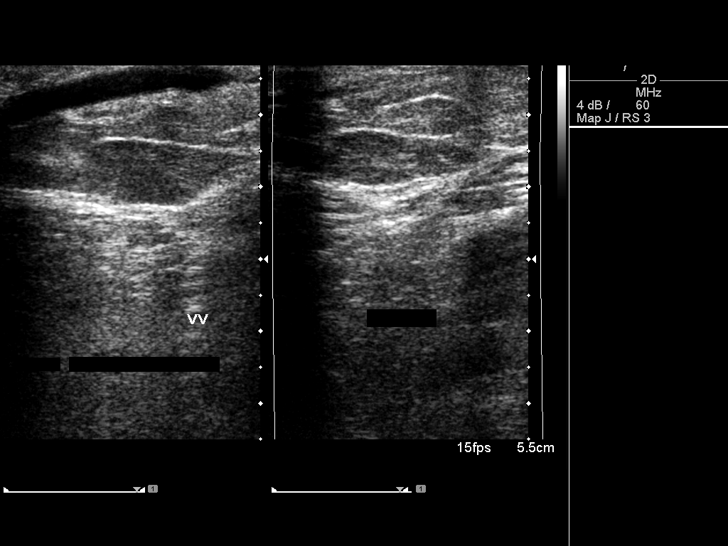
[im 22/32]
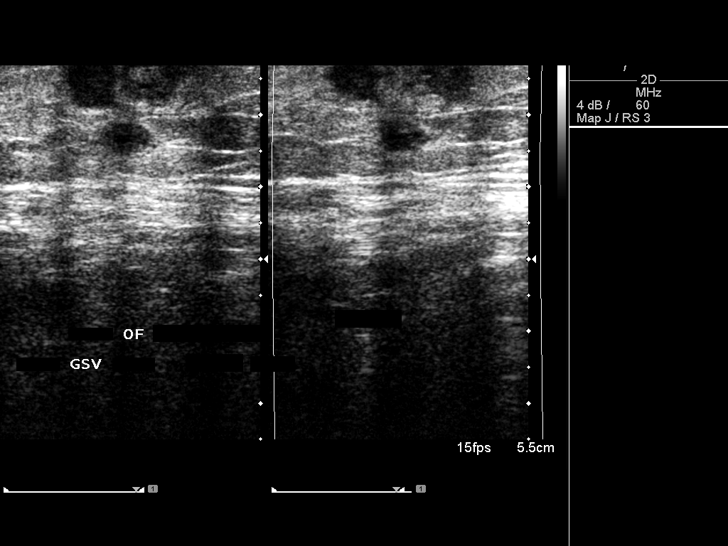
[im 25/32]
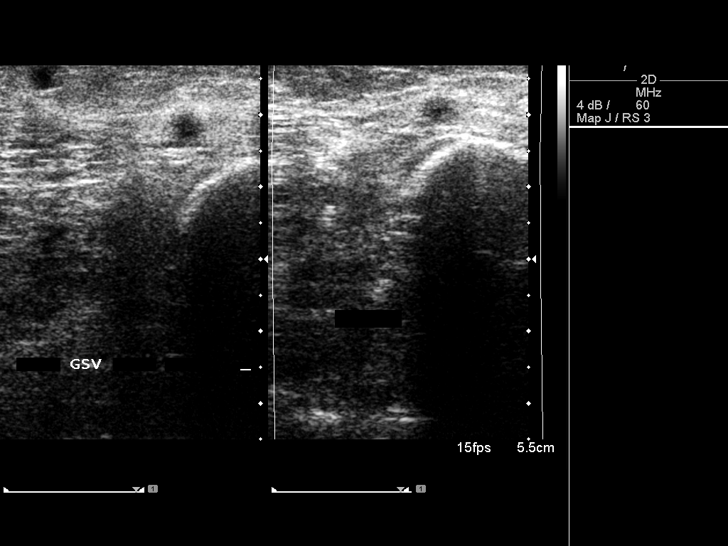
[im 26/32]
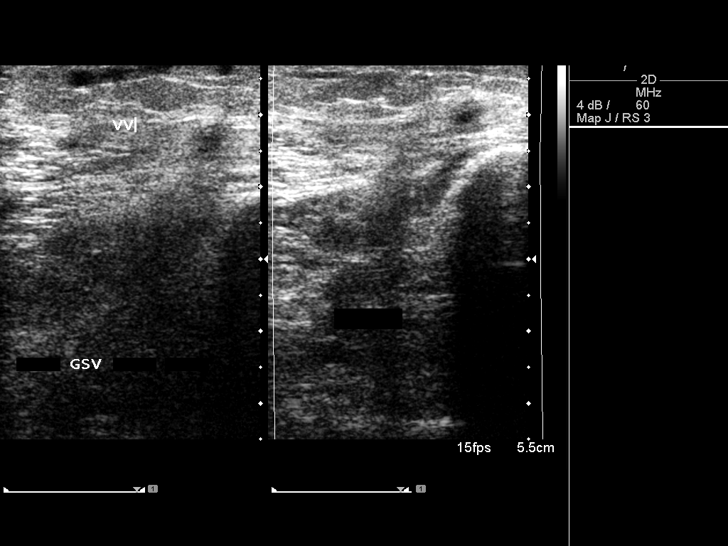
[im 29/32]
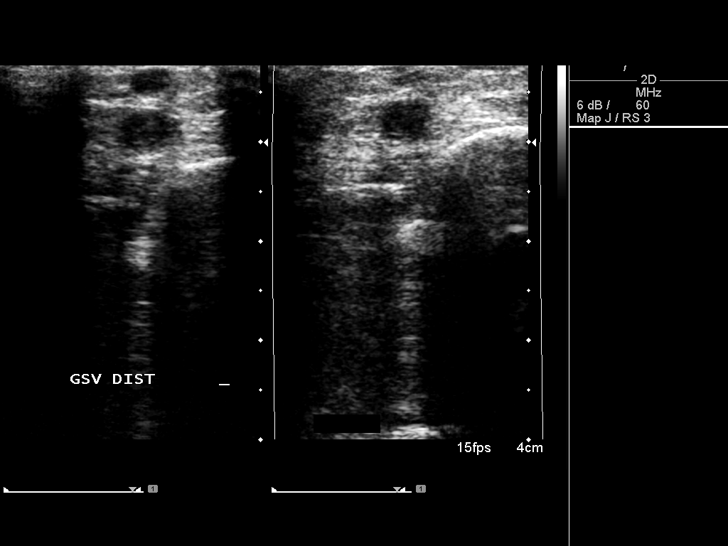
[im 32/32]
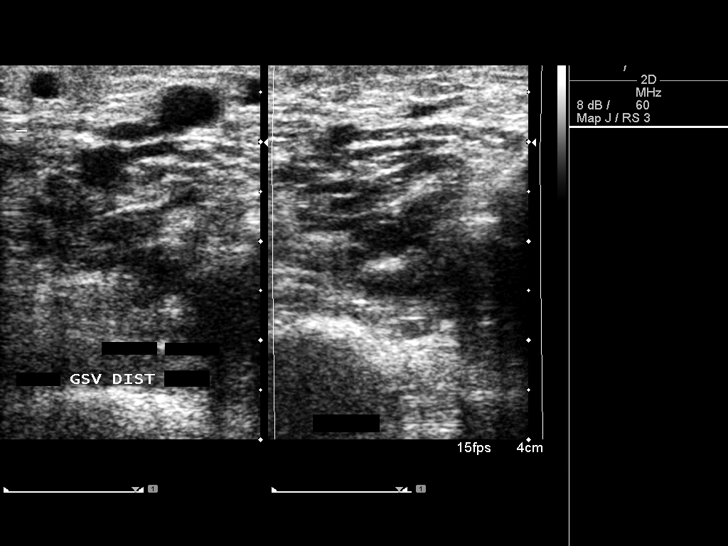

[14 of 24 positions shown; findings below may reference images not displayed]

FINDINGS: The left common femoral, superficial femoral, and
popliteal veins demonstrate normal compressibility, phasicity and
augmentation.  Study is negative for DVT.  The left G S V treated
segment is thrombosed extending from the saphenofemoral junction
into the proximal calf.  Several varicosities remain thrombosed as
before.  Small varicosities are decompressed but remain patent
along the distal calf.
IMPRESSION: Stable occlusion of the left G S V and the larger varicosities.

## 2011-06-04 IMAGING — US US EXTREM LOW VENOUS*L*
1 series · 14 of 24 positions shown · non-contrast
Comparison: 10/07/2008

CLINICAL DATA: 6-month status post left G S V transcatheter laser
occlusion

LEFT LOWER EXTREMITY VENOUS DUPLEX ULTRASOUND
TECHNIQUE: Gray-scale sonography with graded compression, as well
as color Doppler and duplex ultrasound, were performed to evaluate
the deep and superficial veins of the left lower extremity.
Spectral Doppler was utilized to evaluate flow at rest and with
distal augmentation maneuvers.  A complete superficial venous
insufficiency exam was performed in the upright standing position.
I personally performed the technical portion of the exam.

[Series 1: us extrem low venous*left* · 14 of 26 slices shown]
[im 1/26]
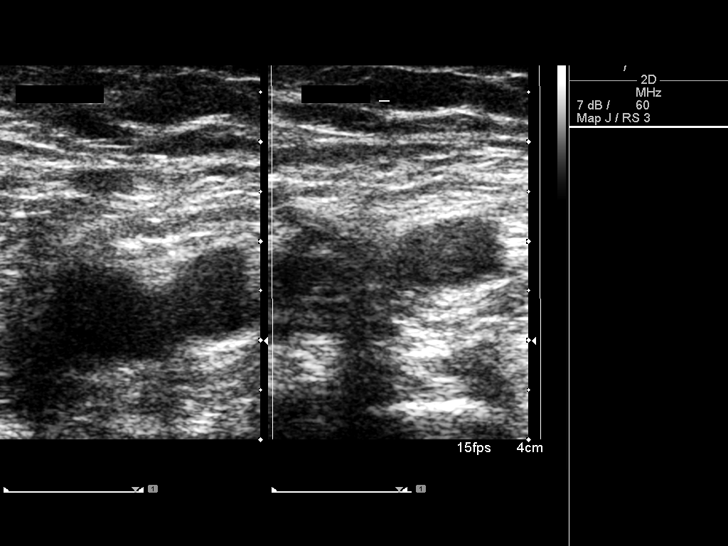
[im 3/26]
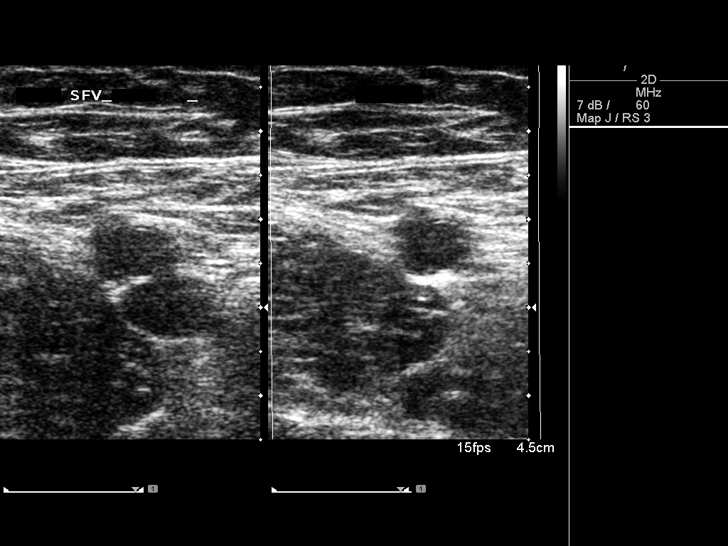
[im 5/26]
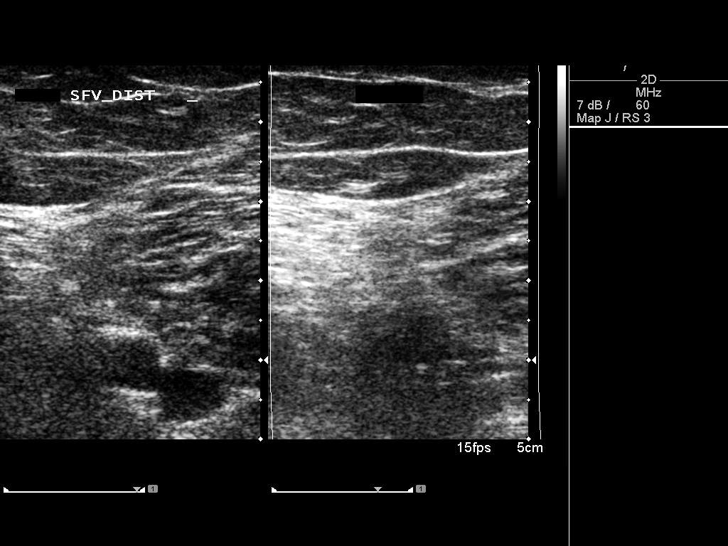
[im 7/26]
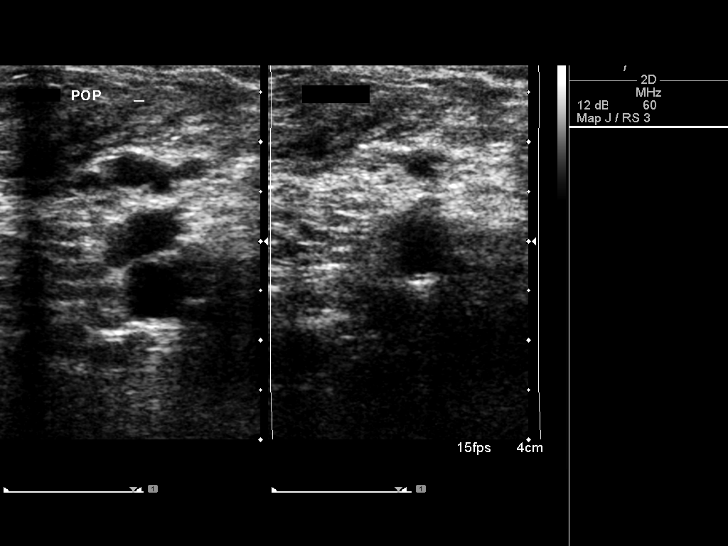
[im 8/26]
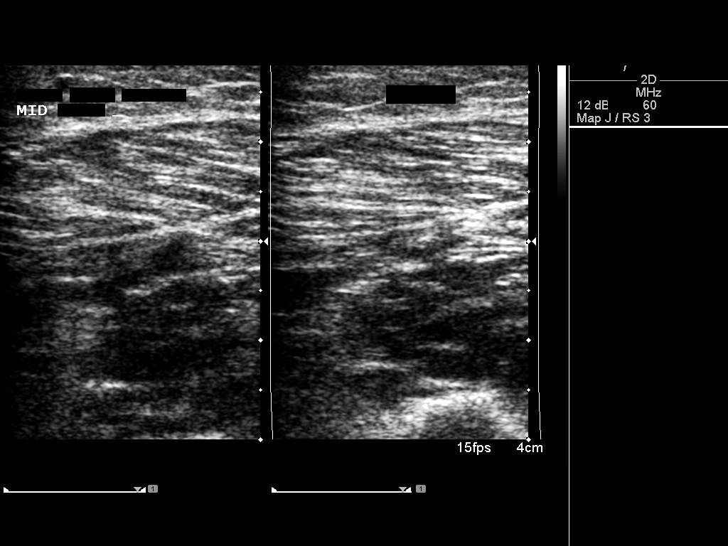
[im 10/26]
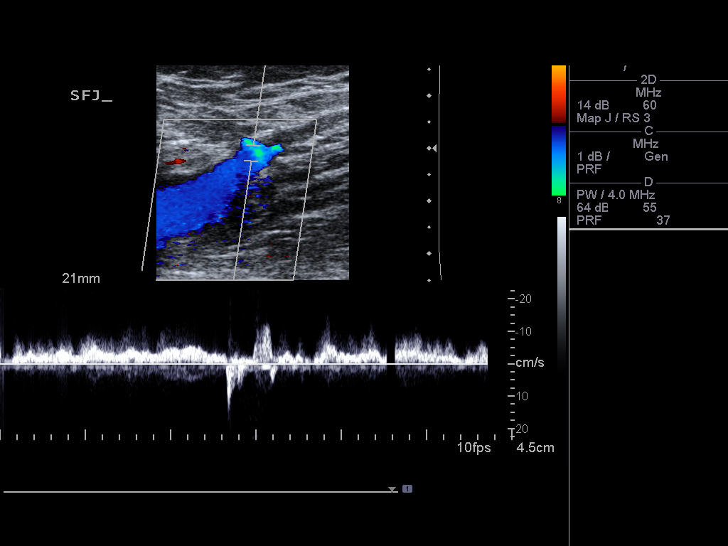
[im 12/26]
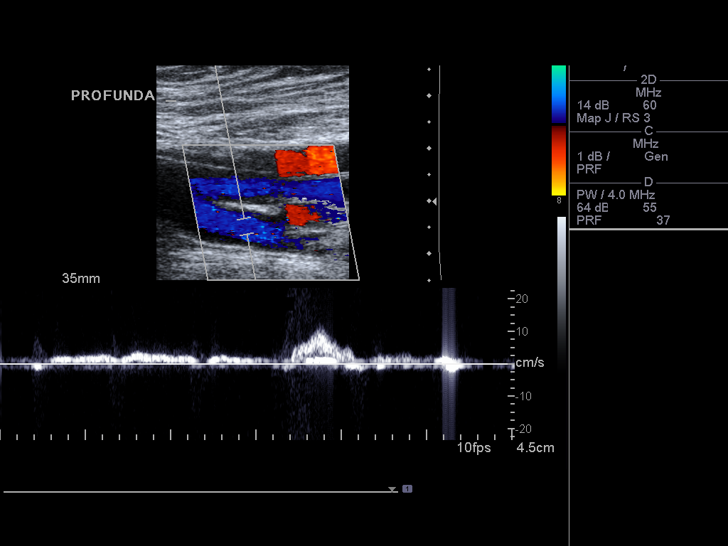
[im 14/26]
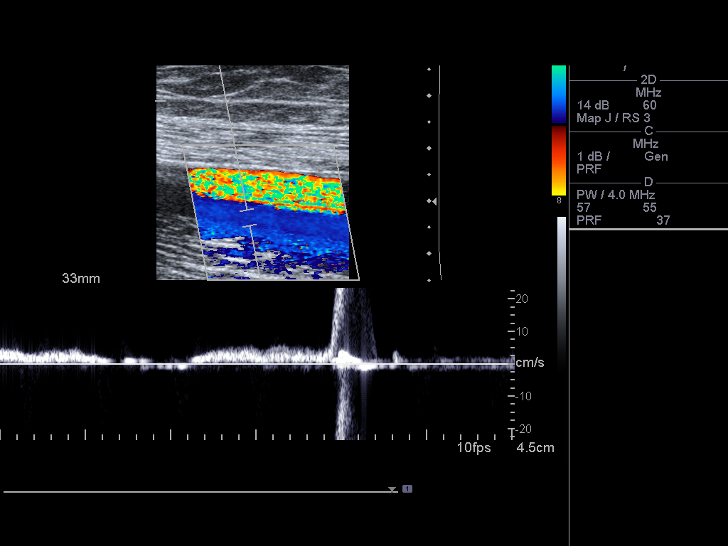
[im 16/26]
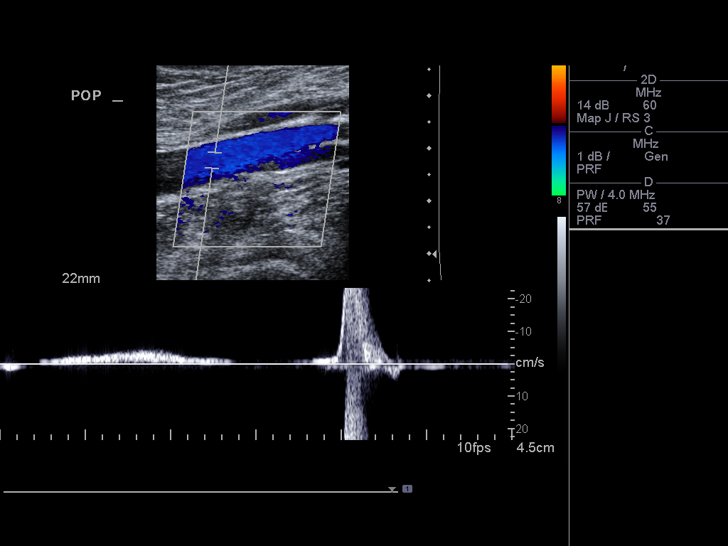
[im 18/26]
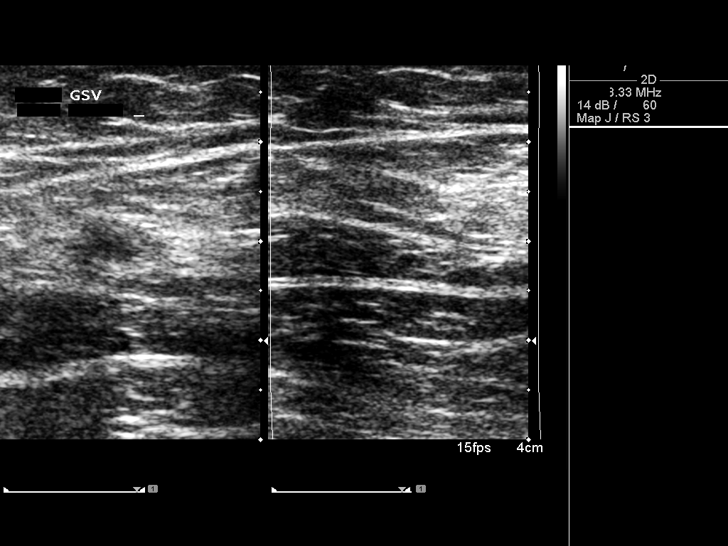
[im 20/26]
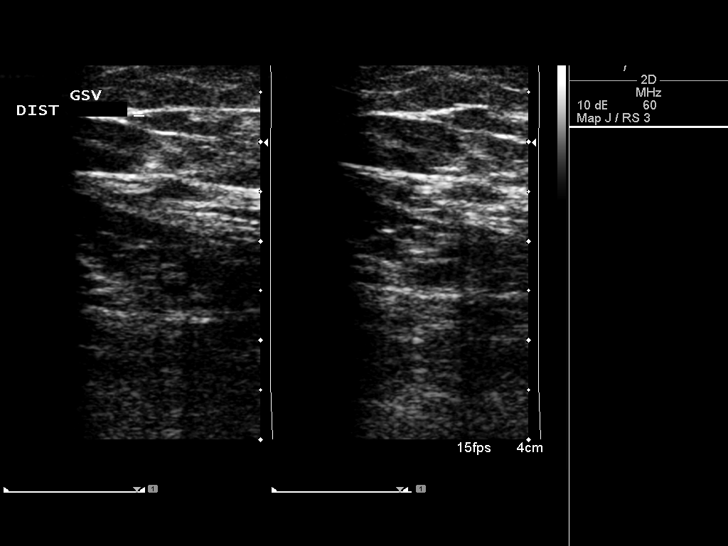
[im 21/26]
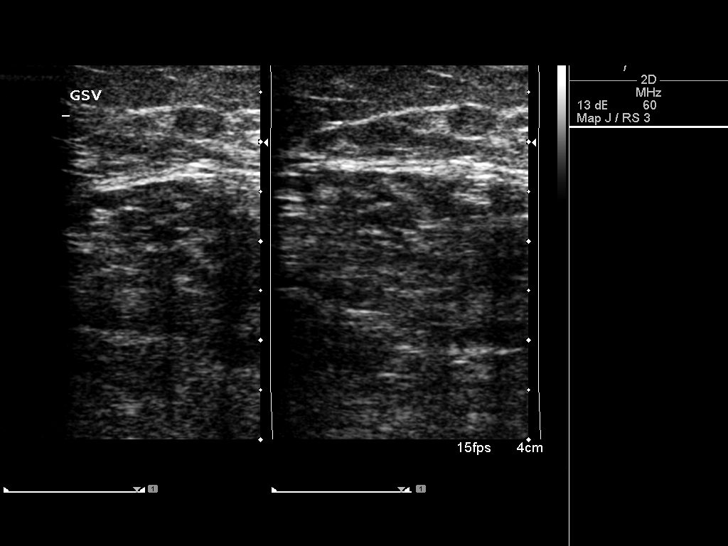
[im 23/26]
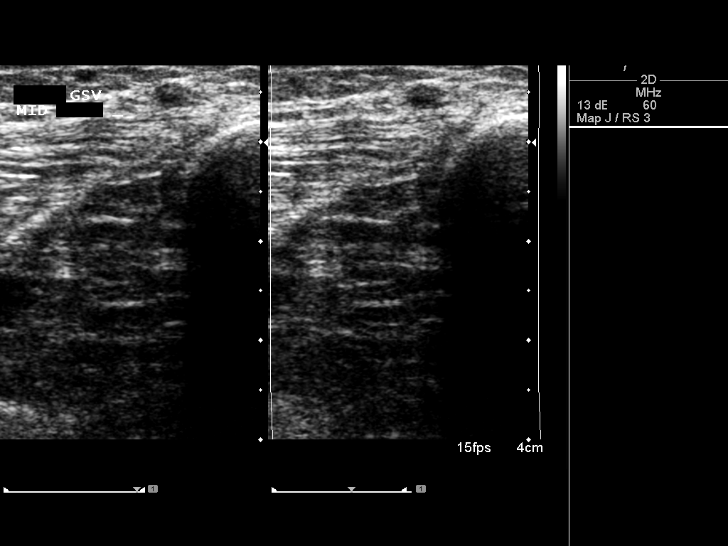
[im 26/26]
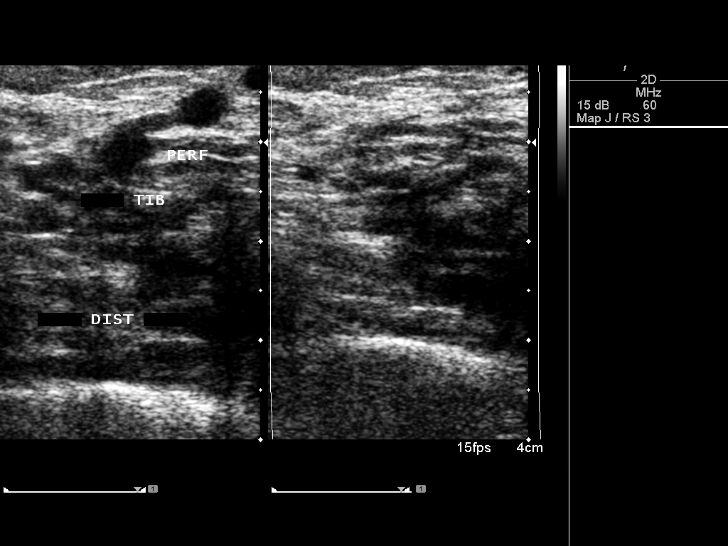

[14 of 24 positions shown; findings below may reference images not displayed]

FINDINGS: Left Lower Extremity:  Left G S V treated segment remains occluded
from the saphenofemoral junction nearly to the ankle.  No evidence
of recanalization.  No significant recurrent sub surface
varicosities.  Negative for DVT.  Left common femoral, femoral, and
popliteal veins demonstrate normal phasicity, augmentation and
compressibility.  Incidental distal tibial perforator noted.
IMPRESSION: Left G S V treated segment remains occluded
Negative for DVT

## 2011-07-27 ENCOUNTER — Other Ambulatory Visit: Payer: Self-pay | Admitting: Internal Medicine

## 2011-07-27 DIAGNOSIS — Z1231 Encounter for screening mammogram for malignant neoplasm of breast: Secondary | ICD-10-CM

## 2011-09-05 ENCOUNTER — Ambulatory Visit
Admission: RE | Admit: 2011-09-05 | Discharge: 2011-09-05 | Disposition: A | Discharge status: Home / Self Care | Payer: Medicare Other | Source: Ambulatory Visit | Attending: Internal Medicine | Admitting: Internal Medicine

## 2011-09-05 DIAGNOSIS — Z1231 Encounter for screening mammogram for malignant neoplasm of breast: Secondary | ICD-10-CM

## 2012-08-17 ENCOUNTER — Other Ambulatory Visit: Payer: Self-pay

## 2012-08-17 DIAGNOSIS — Z1231 Encounter for screening mammogram for malignant neoplasm of breast: Secondary | ICD-10-CM

## 2012-09-17 ENCOUNTER — Ambulatory Visit: Payer: Medicare Other

## 2012-09-18 ENCOUNTER — Ambulatory Visit
Admission: RE | Admit: 2012-09-18 | Discharge: 2012-09-18 | Disposition: A | Discharge status: Home / Self Care | Payer: Medicare PPO | Source: Ambulatory Visit

## 2012-09-18 DIAGNOSIS — Z1231 Encounter for screening mammogram for malignant neoplasm of breast: Secondary | ICD-10-CM

## 2013-07-03 ENCOUNTER — Ambulatory Visit: Payer: Self-pay | Admitting: Podiatry

## 2013-07-17 ENCOUNTER — Encounter: Payer: Self-pay | Admitting: Podiatry

## 2013-07-17 ENCOUNTER — Ambulatory Visit (INDEPENDENT_AMBULATORY_CARE_PROVIDER_SITE_OTHER): Payer: Medicare PPO

## 2013-07-17 ENCOUNTER — Ambulatory Visit (INDEPENDENT_AMBULATORY_CARE_PROVIDER_SITE_OTHER): Payer: Medicare PPO | Admitting: Podiatry

## 2013-07-17 VITALS — BP 141/72 | HR 68 | Resp 16 | Ht 63.0 in | Wt 146.0 lb

## 2013-07-17 DIAGNOSIS — M779 Enthesopathy, unspecified: Secondary | ICD-10-CM

## 2013-07-17 DIAGNOSIS — M2010 Hallux valgus (acquired), unspecified foot: Secondary | ICD-10-CM

## 2013-07-17 DIAGNOSIS — L84 Corns and callosities: Secondary | ICD-10-CM

## 2013-07-17 MED ORDER — TRIAMCINOLONE ACETONIDE 10 MG/ML IJ SUSP
10.0000 mg | Freq: Once | INTRAMUSCULAR | Status: AC
  Administered 2013-07-17: 10 mg

## 2013-07-17 NOTE — Progress Notes (Signed)
   Subjective:    Patient ID: Sheena Davis, female    DOB: 1941-05-10, 73 y.o.   MRN: [id number]  HPI Comments: "I have a bunion that has come back"  Patient c/o achy 1st MPJ right foot for 5years now. Had bunionectomy by orthopedist in 2004 on the right foot. The medial/plantar area has a callused ridge. The area swells and gets red. Shoes are uncomfortable.     Review of Systems  HENT: Positive for hearing loss.   Eyes: Positive for itching.  Musculoskeletal: Positive for arthralgias and gait problem.  Hematological: Bruises/bleeds easily.  All other systems reviewed and are negative.       Objective:   Physical Exam        Assessment & Plan:

## 2013-07-18 NOTE — Progress Notes (Signed)
Subjective:     Patient ID: Sheena Davis, female   DOB: 1940-10-14, 73 y.o.   MRN: [id number]  HPI patient presents stating my bunion is bothering me that it's more on the bottom of the foot than it is around the bump. Had it fixed by an orthopedic surgeon in 2004   Review of Systems  All other systems reviewed and are negative.       Objective:   Physical Exam  Nursing note and vitals reviewed. Constitutional: She is oriented to person, place, and time.  Cardiovascular: Intact distal pulses.   Musculoskeletal: Normal range of motion.  Neurological: She is oriented to person, place, and time.  Skin: Skin is warm.   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 Injected the plantar capsule 3 mg Kenalog 5 mg Xylocaine Marcaine mixture and did deep debridement of lesion with patient to be seen back when reoccurrence occur

## 2013-08-20 ENCOUNTER — Other Ambulatory Visit: Payer: Self-pay

## 2013-08-20 DIAGNOSIS — Z1231 Encounter for screening mammogram for malignant neoplasm of breast: Secondary | ICD-10-CM

## 2013-09-16 ENCOUNTER — Ambulatory Visit: Payer: Medicare PPO | Admitting: Podiatry

## 2013-09-20 ENCOUNTER — Ambulatory Visit
Admission: RE | Admit: 2013-09-20 | Discharge: 2013-09-20 | Disposition: A | Discharge status: Home / Self Care | Payer: Medicare PPO | Source: Ambulatory Visit

## 2013-09-20 ENCOUNTER — Encounter (INDEPENDENT_AMBULATORY_CARE_PROVIDER_SITE_OTHER): Payer: Self-pay

## 2013-09-20 DIAGNOSIS — Z1231 Encounter for screening mammogram for malignant neoplasm of breast: Secondary | ICD-10-CM

## 2013-10-16 ENCOUNTER — Encounter: Payer: Self-pay | Admitting: Podiatry

## 2013-10-16 ENCOUNTER — Ambulatory Visit (INDEPENDENT_AMBULATORY_CARE_PROVIDER_SITE_OTHER): Payer: Medicare PPO | Admitting: Podiatry

## 2013-10-16 VITALS — BP 133/66 | HR 61 | Resp 16

## 2013-10-16 DIAGNOSIS — M779 Enthesopathy, unspecified: Secondary | ICD-10-CM

## 2013-10-16 DIAGNOSIS — L84 Corns and callosities: Secondary | ICD-10-CM

## 2013-10-16 MED ORDER — TRIAMCINOLONE ACETONIDE 10 MG/ML IJ SUSP
10.0000 mg | Freq: Once | INTRAMUSCULAR | Status: AC
  Administered 2013-10-16: 10 mg

## 2013-10-16 NOTE — Progress Notes (Signed)
Subjective:     Patient ID: Sheena Davis, female   DOB: [birth year], 73 y.o.   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

## 2014-03-19 ENCOUNTER — Ambulatory Visit: Payer: Medicare PPO | Admitting: Podiatry

## 2014-03-21 ENCOUNTER — Encounter (HOSPITAL_COMMUNITY): Payer: Self-pay | Admitting: Emergency Medicine

## 2014-03-21 ENCOUNTER — Emergency Department (HOSPITAL_COMMUNITY)
Admission: EM | Admit: 2014-03-21 | Discharge: 2014-03-21 | Disposition: A | Discharge status: Home / Self Care | Payer: Medicare PPO | Attending: Emergency Medicine | Admitting: Emergency Medicine

## 2014-03-21 DIAGNOSIS — H7292 Unspecified perforation of tympanic membrane, left ear: Secondary | ICD-10-CM | POA: Diagnosis not present

## 2014-03-21 DIAGNOSIS — H9202 Otalgia, left ear: Secondary | ICD-10-CM | POA: Diagnosis present

## 2014-03-21 DIAGNOSIS — E785 Hyperlipidemia, unspecified: Secondary | ICD-10-CM | POA: Diagnosis not present

## 2014-03-21 DIAGNOSIS — Z79899 Other long term (current) drug therapy: Secondary | ICD-10-CM | POA: Insufficient documentation

## 2014-03-21 DIAGNOSIS — H409 Unspecified glaucoma: Secondary | ICD-10-CM | POA: Insufficient documentation

## 2014-03-21 DIAGNOSIS — I10 Essential (primary) hypertension: Secondary | ICD-10-CM | POA: Diagnosis not present

## 2014-03-21 MED ORDER — AZITHROMYCIN 250 MG PO TABS: 250.0000 mg | ORAL_TABLET | Freq: Every day | ORAL | Status: DC

## 2014-03-21 NOTE — ED Provider Notes (Signed)
  Face-to-face evaluation   History: Patient injured her left ear by accidentally pushed a Q-tip into far. She complains of mild ear pain and bleeding from the left ear.   Physical exam: Left EAC, mild bleeding.  L TM partially obscured by blood. TM defect is not visible.  Assessment- evaluation is consistent with perforated left TM, there is no apparent foreign body in the left EAC. There is no indication for further evaluation or treatment, in the emergency department setting.  Medical screening examination/treatment/procedure(s) were conducted as a shared visit with non-physician practitioner(s) and myself.  I personally evaluated the patient during the encounter  Richarda Blade, MD 03/23/14 [phone]

## 2014-03-21 NOTE — Discharge Instructions (Signed)
Eardrum Perforation °The eardrum is a thin, round tissue inside the ear that separates the ear canal from the middle ear. This is the tissue that detects sound and enables you to hear. The eardrum can be punctured or torn (perforated). Eardrums generally heal without help and with little or no permanent hearing loss. °CAUSES  °· Sudden pressure changes that happen in situations like scuba diving or flying in an airplane. °· Foreign objects in the ear. °· Inserting a cotton-tipped swab in the ear. °· Loud noise. °· Trauma to the ear. °SYMPTOMS  °· Hearing loss. °· Ear pain. °· Ringing in the ears. °· Discharge or bleeding from the ear. °· Dizziness. °· Vomiting. °· Facial paralysis. °HOME CARE INSTRUCTIONS  °· Keep your ear dry, as this improves healing. Swimming, diving, and showers are not allowed until healing is complete. While bathing, protect the ear by placing a piece of cotton covered with petroleum jelly in the outer ear canal. °· Only take over-the-counter or prescription medicines for pain, discomfort, or fever as directed by your caregiver. °· Blow your nose gently. Forceful blowing increases the pressure in the middle ear and may cause further injury or delay healing. °· Resume normal activities, such as showering, when the perforation has healed. Your caregiver can let you know when this has occurred. °· Talk to your caregiver before flying on an airplane. Air travel is generally allowed with a perforated eardrum. °· If your caregiver has given you a follow-up appointment, it is very important to keep that appointment. Failure to keep the appointment could result in a chronic or permanent injury, pain, hearing loss, and disability. °SEEK IMMEDIATE MEDICAL CARE IF:  °· You have bleeding or pus coming from your ear. °· You have problems with balance, dizziness, nausea, or vomiting. °· You develop increased pain. °· You have a fever. °MAKE SURE YOU:  °· Understand these instructions. °· Will watch your  condition. °· Will get help right away if you are not doing well or get worse. °Document Released: 05/06/2000 Document Revised: 08/01/2011 Document Reviewed: 05/08/2008 °[employer]® Patient Information ©2015 [employer]. This information is not intended to replace advice given to you by your health care provider. Make sure you discuss any questions you have with your health care provider. ° °

## 2014-03-21 NOTE — ED Provider Notes (Signed)
CSN: [id number]     Arrival date & time 03/21/14  1500 History  This chart was scribed for non-physician practitioner, Lorre Munroe, PA-C,working with Richarda Blade, MD, by Marlowe Kays, ED Scribe. This patient was seen in room TR09C/TR09C and the patient's care was started at 3:46 PM.  Chief Complaint  Patient presents with  . Foreign Body in [place]   Patient is a 73 y.o. female presenting with foreign body in ear. The history is provided by the patient. No language interpreter was used.  Foreign Body in Ear   HPI Comments:  Sheena Davis is a 73 y.o. female with PMH of hyperlipidemia, HTN and glaucoma who presents to the Emergency Department complaining of bleeding of the left ear that began earlier today. She reports wiping the ear with a Q-tip prior to placing her hearing aid and accidentally jammed it into her ear. She reports severe pain that has since subsided but reports some muffled hearing from the ear. Denies any modifying factors. Denies nausea, vomiting, dizziness, fever, chills or loss of hearing from the ear.   Past Medical History  Diagnosis Date  . Hyperlipidemia   . Hypertension   . Glaucoma    Past Surgical History  Procedure Laterality Date  . Bunionectomy Bilateral   . Carpal tunnel release    . Tonsillectomy    . Refractive surgery    . Varicose vein surgery     History reviewed. No pertinent family history. History  Substance Use Topics  . Smoking status: Never Smoker   . Smokeless tobacco: Not on file  . Alcohol Use: No   OB History   Grav Para Term Preterm Abortions TAB SAB Ect Mult Living                 Review of Systems  Constitutional: Negative for fever and chills.  HENT: Positive for ear pain. Negative for hearing loss.   Gastrointestinal: Negative for nausea and vomiting.  Neurological: Negative for dizziness.    Allergies  Ampicillin and Aspirin  Home Medications   Prior to Admission medications   Medication Sig Start Date End  Date Taking? Authorizing Provider  atenolol (TENORMIN) 50 MG tablet Take 50 mg by mouth daily.   Yes Historical Provider, MD  atorvastatin (LIPITOR) 20 MG tablet Take 20 mg by mouth at bedtime.   Yes Historical Provider, MD  CALCIUM PO Take 1 tablet by mouth daily.   Yes Historical Provider, MD  carteolol (OCUPRESS) 1 % ophthalmic solution 1 drop 2 (two) times daily.   Yes Historical Provider, MD  chlorthalidone (HYGROTON) 25 MG tablet Take 12.5 mg by mouth daily.   Yes Historical Provider, MD  dorzolamide-timolol (COSOPT) 22.3-6.8 MG/ML ophthalmic solution 1 drop 3 (three) times daily.   Yes Historical Provider, MD  Glucosamine HCl (GLUCOSAMINE PO) Take 1 tablet by mouth daily.   Yes Historical Provider, MD  latanoprost (XALATAN) 0.005 % ophthalmic solution 1 drop at bedtime.   Yes Historical Provider, MD  losartan (COZAAR) 50 MG tablet Take 50 mg by mouth daily.   Yes Historical Provider, MD   Triage Vitals: BP 159/72  Pulse 71  Temp(Src) 97.7 F (36.5 C) (Oral)  Resp 18  SpO2 97% Physical Exam  Nursing note and vitals reviewed. Constitutional: She is oriented to person, place, and time. She appears well-developed and well-nourished.  HENT:  Head: Normocephalic and atraumatic.  Left TM remarkable for small inferior perforation with scant bleeding in ear canal.  Eyes: EOM are  normal.  Neck: Normal range of motion.  Cardiovascular: Normal rate.   Pulmonary/Chest: Effort normal.  Musculoskeletal: Normal range of motion.  Neurological: She is alert and oriented to person, place, and time.  Skin: Skin is warm and dry.  Psychiatric: She has a normal mood and affect. Her behavior is normal.    ED Course  Procedures (including critical care time) DIAGNOSTIC STUDIES: Oxygen Saturation is 97% on RA, normal by my interpretation.   COORDINATION OF CARE: 3:49 PM- Will speak with Dr. Eulis Foster about appropriate course of treatment. Pt verbalizes understanding and agrees to plan.  3:54 PM-  Dr. Eulis Foster at bedside to see pt.  Medications - No data to display  Labs Review Labs Reviewed - No data to display  Imaging Review No results found.   EKG Interpretation None      MDM   Final diagnoses:  Tympanic membrane perforation, left    Patient with tympanic membrane perforation of left ear. Patient seen by and discussed with Dr. Eulis Foster. Will treat with azithromycin. Patient has penicillin and related allergy. Precautions given. Recommend primary care follow-up in 1 week.  I personally performed the services described in this documentation, which was scribed in my presence. The recorded information has been reviewed and is accurate.    Montine Circle, PA-C 03/21/14 [phone]

## 2014-03-21 NOTE — ED Notes (Signed)
Per pt sts was cleaning her left ear this am she jammed a cu-tip by accident in her ear. sts pain and slight blood. sts some muffled sound

## 2014-09-29 DIAGNOSIS — H4011X1 Primary open-angle glaucoma, mild stage: Secondary | ICD-10-CM | POA: Diagnosis not present

## 2014-09-29 DIAGNOSIS — H2513 Age-related nuclear cataract, bilateral: Secondary | ICD-10-CM | POA: Diagnosis not present

## 2014-09-29 DIAGNOSIS — H25042 Posterior subcapsular polar age-related cataract, left eye: Secondary | ICD-10-CM | POA: Diagnosis not present

## 2014-10-17 ENCOUNTER — Other Ambulatory Visit: Payer: Self-pay

## 2014-10-17 DIAGNOSIS — Z1231 Encounter for screening mammogram for malignant neoplasm of breast: Secondary | ICD-10-CM

## 2014-10-22 ENCOUNTER — Ambulatory Visit: Payer: Medicare PPO

## 2014-10-28 ENCOUNTER — Ambulatory Visit
Admission: RE | Admit: 2014-10-28 | Discharge: 2014-10-28 | Disposition: A | Discharge status: Home / Self Care | Payer: Medicare PPO | Source: Ambulatory Visit

## 2014-10-28 DIAGNOSIS — Z1231 Encounter for screening mammogram for malignant neoplasm of breast: Secondary | ICD-10-CM

## 2014-11-10 DIAGNOSIS — M1712 Unilateral primary osteoarthritis, left knee: Secondary | ICD-10-CM | POA: Diagnosis not present

## 2014-11-25 DIAGNOSIS — H2512 Age-related nuclear cataract, left eye: Secondary | ICD-10-CM | POA: Diagnosis not present

## 2014-11-25 DIAGNOSIS — H25812 Combined forms of age-related cataract, left eye: Secondary | ICD-10-CM | POA: Diagnosis not present

## 2014-12-20 DIAGNOSIS — T148 Other injury of unspecified body region: Secondary | ICD-10-CM | POA: Diagnosis not present

## 2014-12-20 DIAGNOSIS — W57XXXA Bitten or stung by nonvenomous insect and other nonvenomous arthropods, initial encounter: Secondary | ICD-10-CM | POA: Diagnosis not present

## 2014-12-23 ENCOUNTER — Encounter: Payer: Self-pay | Admitting: Internal Medicine

## 2014-12-30 DIAGNOSIS — H25811 Combined forms of age-related cataract, right eye: Secondary | ICD-10-CM | POA: Diagnosis not present

## 2014-12-30 DIAGNOSIS — H2511 Age-related nuclear cataract, right eye: Secondary | ICD-10-CM | POA: Diagnosis not present

## 2015-02-04 DIAGNOSIS — M1712 Unilateral primary osteoarthritis, left knee: Secondary | ICD-10-CM | POA: Diagnosis not present

## 2015-02-09 DIAGNOSIS — Z961 Presence of intraocular lens: Secondary | ICD-10-CM | POA: Diagnosis not present

## 2015-03-20 DIAGNOSIS — I10 Essential (primary) hypertension: Secondary | ICD-10-CM | POA: Diagnosis not present

## 2015-04-06 DIAGNOSIS — H401122 Primary open-angle glaucoma, left eye, moderate stage: Secondary | ICD-10-CM | POA: Diagnosis not present

## 2015-04-06 DIAGNOSIS — H401111 Primary open-angle glaucoma, right eye, mild stage: Secondary | ICD-10-CM | POA: Diagnosis not present

## 2015-05-04 DIAGNOSIS — M1712 Unilateral primary osteoarthritis, left knee: Secondary | ICD-10-CM | POA: Diagnosis not present

## 2015-07-15 ENCOUNTER — Encounter: Payer: Self-pay | Admitting: Gastroenterology

## 2016-03-09 ENCOUNTER — Other Ambulatory Visit: Payer: Self-pay | Admitting: Vascular Surgery

## 2016-03-09 DIAGNOSIS — I83892 Varicose veins of left lower extremities with other complications: Secondary | ICD-10-CM

## 2016-04-20 ENCOUNTER — Encounter: Payer: Self-pay | Admitting: Vascular Surgery

## 2016-04-25 ENCOUNTER — Ambulatory Visit (INDEPENDENT_AMBULATORY_CARE_PROVIDER_SITE_OTHER): Payer: Medicare Other | Admitting: Vascular Surgery

## 2016-04-25 ENCOUNTER — Ambulatory Visit (HOSPITAL_COMMUNITY)
Admission: RE | Admit: 2016-04-25 | Discharge: 2016-04-25 | Disposition: A | Discharge status: Home / Self Care | Payer: Medicare Other | Source: Ambulatory Visit | Attending: Surgery | Admitting: Surgery

## 2016-04-25 ENCOUNTER — Encounter: Payer: Self-pay | Admitting: Vascular Surgery

## 2016-04-25 VITALS — BP 124/64 | HR 73 | Temp 97.4°F | Resp 16 | Ht 63.5 in | Wt 159.0 lb

## 2016-04-25 DIAGNOSIS — I83892 Varicose veins of left lower extremities with other complications: Secondary | ICD-10-CM | POA: Diagnosis present

## 2016-04-25 NOTE — Progress Notes (Signed)
Subjective:     Patient ID: Sheena Davis, female   DOB: 1940/10/21, 75 y.o.   MRN: [id number]  HPI This 75 year old female was referred by Dr. [name] for evaluation of painful varicosities in the left leg. Patient has previously had laser treatment of the left leg by Dr. Daryll Brod in 2009 with a good result in the left thigh. Over the past several years patient has developed painful varicosities in the left calf and ankle area. These developed aching burning and stinging discomfort which worsens as the day progresses. She does not were elastic compression stockings. She has no history of DVT thrombophlebitis stasis ulcers or bleeding. She has minimal symptoms in the contralateral right leg. She states the bulges in the left calf area have continued to enlarge on a regular basis over the past several months.  Past Medical History:  Diagnosis Date  . Glaucoma   . Hyperlipidemia   . Hypertension     Social History  Substance Use Topics  . Smoking status: Never Smoker  . Smokeless tobacco: Never Used  . Alcohol use No    Family History  Problem Relation Age of Onset  . Colon cancer      Allergies  Allergen Reactions  . Ampicillin     REACTION: bleeding  . Aspirin     REACTION: bleeding     Current Outpatient Prescriptions:  .  atenolol (TENORMIN) 50 MG tablet, Take 50 mg by mouth daily., Disp: , Rfl:  .  atorvastatin (LIPITOR) 20 MG tablet, Take 20 mg by mouth at bedtime., Disp: , Rfl:  .  azithromycin (ZITHROMAX Z-PAK) 250 MG tablet, Take 1 tablet (250 mg total) by mouth daily. 500mg  PO day 1, then 250mg  PO days 205, Disp: 6 tablet, Rfl: 0 .  CALCIUM PO, Take 1 tablet by mouth daily., Disp: , Rfl:  .  carteolol (OCUPRESS) 1 % ophthalmic solution, 1 drop 2 (two) times daily., Disp: , Rfl:  .  chlorthalidone (HYGROTON) 25 MG tablet, Take 12.5 mg by mouth daily., Disp: , Rfl:  .  dorzolamide-timolol (COSOPT) 22.3-6.8 MG/ML ophthalmic solution, 1 drop 3 (three) times  daily., Disp: , Rfl:  .  Glucosamine HCl (GLUCOSAMINE PO), Take 1 tablet by mouth daily., Disp: , Rfl:  .  latanoprost (XALATAN) 0.005 % ophthalmic solution, 1 drop at bedtime., Disp: , Rfl:  .  losartan (COZAAR) 50 MG tablet, Take 50 mg by mouth daily., Disp: , Rfl:   Vitals:   04/25/16 1253  BP: 124/64  Pulse: 73  Resp: 16  Temp: 97.4 F (36.3 C)  SpO2: 97%  Weight: 159 lb (72.1 kg)  Height: 5' 3.5" (1.613 m)    Body mass index is 27.72 kg/m.         Review of Systems Denies chest pain, dyspnea on exertion, PND, orthopnea, hemoptysis, claudication. Has history of cardiac arrhythmia treated with medication but the majority of time does not have arrhythmia. Patient does have hypertension and hyperlipidemia treated medically. All other systems negative and complete review of systems    Objective:   Physical Exam BP 124/64 (BP Location: Left Arm, Patient Position: Sitting, Cuff Size: Normal)   Pulse 73   Temp 97.4 F (36.3 C)   Resp 16   Ht 5' 3.5" (1.613 m)   Wt 159 lb (72.1 kg)   SpO2 97%   BMI 27.72 kg/m     Gen.-alert and oriented x3 in no apparent distress HEENT normal for age Lungs no rhonchi or  wheezing Cardiovascular regular rhythm no murmurs carotid pulses 3+ palpable no bruits audible Abdomen soft nontender no palpable masses Musculoskeletal free of  major deformities Skin clear -no rashes Neurologic normal Lower extremities 3+ femoral and dorsalis pedis pulses palpable bilaterally with no edema on the right 1+ edema on the left Early hyperpigmentation lower third left leg Bulging varicosities over the great saphenous system beginning in the distal thigh medially extending down to the medial malleolar area. No ulceration noted.  Today I ordered a formal venous duplex exam the left leg which I reviewed and interpreted. There is no DVT. There is gross reflux in the left great saphenous and small saphenous veins with particular reflux in the left calf area  where the varicosities are located.  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 This is causing symptoms which are affecting patient's daily living. Has remote history of laser treatment and left thigh 45 varicosities #2 hyperlipidemia #3 hypertension #4 history of cardiac arrhythmia treated medically    Plan:         #1 long leg elastic compression stockings 20-30 mm gradient #2 elevate legs as much as possible #3 ibuprofen daily on a regular basis for pain #4 return in 3 months-if no significant improvement then she will need laser ablation left great saphenous vein beginning in the distal calf. There will then be a three-month waiting. 2 return to see if multiple stab phlebectomy of residual painful varicosities would be indicated Return in 3 months

## 2016-07-19 ENCOUNTER — Encounter: Payer: Self-pay | Admitting: Vascular Surgery

## 2016-07-25 ENCOUNTER — Encounter: Payer: Self-pay | Admitting: Vascular Surgery

## 2016-07-25 ENCOUNTER — Ambulatory Visit (INDEPENDENT_AMBULATORY_CARE_PROVIDER_SITE_OTHER): Payer: Medicare Other | Admitting: Vascular Surgery

## 2016-07-25 VITALS — BP 131/76 | HR 65 | Temp 97.4°F | Resp 18 | Ht 63.5 in | Wt 161.5 lb

## 2016-07-25 DIAGNOSIS — I83892 Varicose veins of left lower extremities with other complications: Secondary | ICD-10-CM | POA: Diagnosis not present

## 2016-07-25 NOTE — Progress Notes (Signed)
Subjective:     Patient ID: Sheena Davis, female   DOB: 11-Sep-1940, 76 y.o.   MRN: [id number]  HPI This 76 year old female returns 3 months following initial evaluation for painful varicosities in the left thigh and calf. She is tried long-leg elastic compression stockings 20-30 millimeter gradient as well as elevation and ibuprofen but continues to have aching throbbing and burning discomfort which worsens as the day progresses. She also developed progressive edema in the left ankle. The symptoms are affecting her daily living.  Past Medical History:  Diagnosis Date  . Glaucoma   . Hyperlipidemia   . Hypertension     Social History  Substance Use Topics  . Smoking status: Never Smoker  . Smokeless tobacco: Never Used  . Alcohol use 8.4 oz/week    14 Glasses of wine per week    Family History  Problem Relation Age of Onset  . Colon cancer      Allergies  Allergen Reactions  . Ampicillin     REACTION: bleeding  . Aspirin     REACTION: bleeding     Current Outpatient Prescriptions:  .  atenolol (TENORMIN) 50 MG tablet, Take 50 mg by mouth daily., Disp: , Rfl:  .  atorvastatin (LIPITOR) 20 MG tablet, Take 20 mg by mouth at bedtime., Disp: , Rfl:  .  CALCIUM PO, Take 1 tablet by mouth daily., Disp: , Rfl:  .  carteolol (OCUPRESS) 1 % ophthalmic solution, 1 drop 2 (two) times daily., Disp: , Rfl:  .  chlorthalidone (HYGROTON) 25 MG tablet, Take 12.5 mg by mouth daily., Disp: , Rfl:  .  dorzolamide-timolol (COSOPT) 22.3-6.8 MG/ML ophthalmic solution, 1 drop 3 (three) times daily., Disp: , Rfl:  .  Glucosamine HCl (GLUCOSAMINE PO), Take 1 tablet by mouth daily., Disp: , Rfl:  .  latanoprost (XALATAN) 0.005 % ophthalmic solution, 1 drop at bedtime., Disp: , Rfl:  .  losartan (COZAAR) 50 MG tablet, Take 50 mg by mouth daily., Disp: , Rfl:  .  azithromycin (ZITHROMAX Z-PAK) 250 MG tablet, Take 1 tablet (250 mg total) by mouth daily. 500mg  PO day 1, then 250mg  PO days 205 (Patient  not taking: Reported on 07/25/2016), Disp: 6 tablet, Rfl: 0  Vitals:   07/25/16 1033  BP: 131/76  Pulse: 65  Resp: 18  Temp: 97.4 F (36.3 C)  TempSrc: Oral  SpO2: 93%  Weight: 161 lb 8 oz (73.3 kg)  Height: 5' 3.5" (1.613 m)    Body mass index is 28.16 kg/m.         Review of Systems Denies chest pain, dyspnea on exertion, PND, orthopnea, hemoptysis, claudication    Objective:   Physical Exam BP 131/76 (BP Location: Left Arm, Patient Position: Sitting, Cuff Size: Normal)   Pulse 65   Temp 97.4 F (36.3 C) (Oral)   Resp 18   Ht 5' 3.5" (1.613 m)   Wt 161 lb 8 oz (73.3 kg)   SpO2 93%   BMI 28.16 kg/m   Gen. well-developed well-nourished female no apparent distress alert and oriented 3 Lungs no rhonchi or wheezing Left leg with bulging varicosities in the distal medial thigh and across the knee area over the great saphenous system. Second large cluster of bulging varicosities left distal calf over the great saphenous vein just proximal to medial malleolus. 1+ edema distally. No active ulcer.  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 2 then be evaluated for possible stab phlebectomy of residual varicosities We will proceed with precertification to perform this in the near future and relieve her symptoms.

## 2016-07-28 ENCOUNTER — Other Ambulatory Visit: Payer: Self-pay | Admitting: *Deleted

## 2016-07-28 DIAGNOSIS — I83892 Varicose veins of left lower extremities with other complications: Secondary | ICD-10-CM

## 2016-08-17 ENCOUNTER — Encounter: Payer: Self-pay | Admitting: Vascular Surgery

## 2016-08-25 ENCOUNTER — Encounter: Payer: Self-pay | Admitting: Vascular Surgery

## 2016-08-30 ENCOUNTER — Encounter: Payer: Self-pay | Admitting: Vascular Surgery

## 2016-08-30 ENCOUNTER — Ambulatory Visit (INDEPENDENT_AMBULATORY_CARE_PROVIDER_SITE_OTHER): Payer: Medicare Other | Admitting: Vascular Surgery

## 2016-08-30 VITALS — BP 148/69 | HR 76 | Temp 97.0°F | Resp 16 | Ht 63.5 in | Wt 161.0 lb

## 2016-08-30 DIAGNOSIS — I83892 Varicose veins of left lower extremities with other complications: Secondary | ICD-10-CM | POA: Diagnosis not present

## 2016-08-30 HISTORY — PX: ENDOVENOUS ABLATION SAPHENOUS VEIN W/ LASER: SUR449

## 2016-08-30 NOTE — Progress Notes (Signed)
Laser Ablation Procedure    Date: 08/30/2016   Sheena Davis DOB:[birth year]  Consent signed: Yes    Surgeon:  Dr. Nelda Severe. Kellie Simmering  Procedure: Laser Ablation: left Greater Saphenous Vein  BP (!) 148/69 (BP Location: Left Arm, Patient Position: Sitting, Cuff Size: Normal)   Pulse 76   Temp 97 F (36.1 C) (Oral)   Resp 16   Ht 5' 3.5" (1.613 m)   Wt 161 lb (73 kg)   SpO2 97%   BMI 28.07 kg/m   Tumescent Anesthesia: 250 cc 0.9% NaCl with 50 cc Lidocaine HCL with 1% Epi and 15 cc 8.4% NaHCO3  Local Anesthesia: 2 cc Lidocaine HCL and NaHCO3 (ratio 2:1)  Pulsed Mode: 15 watts, 585ms delay, 1.0 duration  Total Energy:  897 Joules            Total Pulses: 60               Total Time: 59 seconds      Patient tolerated procedure well    Description of Procedure:  After marking the course of the secondary varicosities, the patient was placed on the operating table in the supine position, and the left leg was prepped and draped in sterile fashion.   Local anesthetic was administered and under ultrasound guidance the saphenous vein was accessed with a micro needle and guide wire; then the mirco puncture sheath was placed.  A guide wire was inserted saphenofemoral junction , followed by a 5 french sheath.  The position of the sheath and then the laser fiber below the junction was confirmed using the ultrasound.  Tumescent anesthesia was administered along the course of the saphenous vein using ultrasound guidance. The patient was placed in Trendelenburg position and protective laser glasses were placed on patient and staff, and the laser was fired at 15 watts continuous mode advancing 1-59mm/second for a total of 897 joules.       Steri strip was applied and ABD pads and thigh high compression stockings were applied.  Ace wrap bandages were applied  at the top of the saphenofemoral junction. Blood loss was less than 15 cc.  The patient ambulated out of the operating room having tolerated  the procedure well.

## 2016-08-30 NOTE — Progress Notes (Signed)
Subjective:     Patient ID: Sheena Davis, female   DOB: 17-Feb-1941, 76 y.o.   MRN: [id number]  HPI This 76 year old female had laser ablation left great saphenous vein from the mid thigh to near the saphenofemoral junction performed under local tumescent anesthesia. A total of 897 J of energy was utilized. She tolerated the procedure well.  Review of Systems     Objective:   Physical Exam BP (!) 148/69 (BP Location: Left Arm, Patient Position: Sitting, Cuff Size: Normal)   Pulse 76   Temp 97 F (36.1 C) (Oral)   Resp 16   Ht 5' 3.5" (1.613 m)   Wt 161 lb (73 kg)   SpO2 97%   BMI 28.07 kg/m        Assessment:     Well-tolerated laser ablation left great saphenous vein performed under local tumescent anesthesia    Plan:     Return in 1 week for venous duplex exam to confirm closure left great saphenous vein Patient will then wait 3 months before returning to be evaluated for possible stab phlebectomy of residual painful varicosities

## 2016-09-01 ENCOUNTER — Encounter: Payer: Self-pay | Admitting: Vascular Surgery

## 2016-09-06 ENCOUNTER — Ambulatory Visit (HOSPITAL_COMMUNITY)
Admission: RE | Admit: 2016-09-06 | Discharge: 2016-09-06 | Disposition: A | Discharge status: Home / Self Care | Payer: Medicare Other | Source: Ambulatory Visit | Attending: Vascular Surgery | Admitting: Vascular Surgery

## 2016-09-06 ENCOUNTER — Encounter: Payer: Self-pay | Admitting: Vascular Surgery

## 2016-09-06 ENCOUNTER — Ambulatory Visit (INDEPENDENT_AMBULATORY_CARE_PROVIDER_SITE_OTHER): Payer: Medicare Other | Admitting: Vascular Surgery

## 2016-09-06 VITALS — BP 125/64 | HR 66 | Temp 97.0°F | Resp 16 | Ht 63.5 in | Wt 162.4 lb

## 2016-09-06 DIAGNOSIS — I83892 Varicose veins of left lower extremities with other complications: Secondary | ICD-10-CM | POA: Diagnosis not present

## 2016-09-06 NOTE — Progress Notes (Signed)
Subjective:     Patient ID: Sheena Davis, female   DOB: 04/08/1941, 76 y.o.   MRN: [id number]  HPI This 76 year old female returns 77 week post-laser ablation left great saphenous vein for gross reflux with painful varicosities. She has worn her long leg elastic compression stockings and take an ibuprofen as instructed. He has had mild to moderate discomfort and a moderate amount of bruising in the proximal half of the left medial thigh. She's had no distal edema.  Past Medical History:  Diagnosis Date  . Glaucoma   . Hyperlipidemia   . Hypertension     Social History  Substance Use Topics  . Smoking status: Never Smoker  . Smokeless tobacco: Never Used  . Alcohol use 8.4 oz/week    14 Glasses of wine per week    Family History  Problem Relation Age of Onset  . Colon cancer      Allergies  Allergen Reactions  . Ampicillin     REACTION: bleeding  . Aspirin     REACTION: bleeding     Current Outpatient Prescriptions:  .  atenolol (TENORMIN) 50 MG tablet, Take 50 mg by mouth daily., Disp: , Rfl:  .  atorvastatin (LIPITOR) 20 MG tablet, Take 20 mg by mouth at bedtime., Disp: , Rfl:  .  CALCIUM PO, Take 1 tablet by mouth daily., Disp: , Rfl:  .  carteolol (OCUPRESS) 1 % ophthalmic solution, 1 drop 2 (two) times daily., Disp: , Rfl:  .  chlorthalidone (HYGROTON) 25 MG tablet, Take 12.5 mg by mouth daily., Disp: , Rfl:  .  dorzolamide-timolol (COSOPT) 22.3-6.8 MG/ML ophthalmic solution, 1 drop 3 (three) times daily., Disp: , Rfl:  .  Glucosamine HCl (GLUCOSAMINE PO), Take 1 tablet by mouth daily., Disp: , Rfl:  .  latanoprost (XALATAN) 0.005 % ophthalmic solution, 1 drop at bedtime., Disp: , Rfl:  .  losartan (COZAAR) 50 MG tablet, Take 50 mg by mouth daily., Disp: , Rfl:  .  azithromycin (ZITHROMAX Z-PAK) 250 MG tablet, Take 1 tablet (250 mg total) by mouth daily. 500mg  PO day 1, then 250mg  PO days 205 (Patient not taking: Reported on 07/25/2016), Disp: 6 tablet, Rfl:  0  Vitals:   09/06/16 1040  BP: 125/64  Pulse: 66  Resp: 16  Temp: 97 F (36.1 C)  TempSrc: Oral  SpO2: 96%  Weight: 162 lb 6.4 oz (73.7 kg)  Height: 5' 3.5" (1.613 m)    Body mass index is 28.32 kg/m.         Review of Systems Denies chest pain, dyspnea on exertion, PND, orthopnea, hemoptysis, claudication. Does have hypertension and hypercholesterolemia.    Objective:   Physical Exam BP 125/64 (BP Location: Left Arm, Patient Position: Sitting, Cuff Size: Normal)   Pulse 66   Temp 97 F (36.1 C) (Oral)   Resp 16   Ht 5' 3.5" (1.613 m)   Wt 162 lb 6.4 oz (73.7 kg)   SpO2 96%   BMI 28.32 kg/m   Gen. well-developed well-nourished female in no apparent distress alert and oriented 3 Lungs no rhonchi or wheezing Cardiovascular regular rhythm no murmurs Left leg with moderate ecchymosis proximal half of medial thigh at the inguinal crease with mild discomfort to deep palpation. Bulging varicosities in the distal medial thigh and medial calf. No distal edema noted. 2+ dorsalis pedis pulse palpable.  Today I ordered a venous duplex exam the left leg which I reviewed and interpreted. There is no DVT. There is total  closure of the left great saphenous vein from the mid thigh to near the saphenofemoral junction.    Assessment:     Successful laser ablation left great saphenous vein for gross reflux with painful varicosities    Plan:     Patient to return in 3 months for further evaluation regarding possible stab phlebectomy of residual painful varicosities

## 2016-11-02 ENCOUNTER — Ambulatory Visit (INDEPENDENT_AMBULATORY_CARE_PROVIDER_SITE_OTHER): Payer: Medicare Other | Admitting: Orthopedic Surgery

## 2016-11-02 ENCOUNTER — Encounter (INDEPENDENT_AMBULATORY_CARE_PROVIDER_SITE_OTHER): Payer: Self-pay | Admitting: Orthopedic Surgery

## 2016-11-02 VITALS — Ht 63.0 in | Wt 162.0 lb

## 2016-11-02 DIAGNOSIS — M170 Bilateral primary osteoarthritis of knee: Secondary | ICD-10-CM | POA: Diagnosis not present

## 2016-11-02 NOTE — Progress Notes (Signed)
Office Visit Note   Patient: Sheena Davis           Date of Birth: 1941-04-30           MRN: [id number] Visit Date: 11/02/2016              Requested by: [name], MD [address] [place] [address], [place] [address] PCP: [name], MD  Chief Complaint  Patient presents with  . Right Knee - Weakness      HPI:  patient presents complaining of bilateral knee pain and weakness. She states she has difficulty walking on uneven terrain she states she has decreased range of motion. Patient is wondering if she needs a total knee replacement. She states she has been to flexogenic clinic and has been undergoing hyaluronic acid injections. Patient's past medical history is reviewed and this was positive for arthritis which involves her PIP and DIP joints of her fingers history of blood clots cataracts glaucoma and hypertension.  Assessment & Plan: Visit Diagnoses:  1. Bilateral primary osteoarthritis of knee     Plan:  Recommended strengthening and that she should look into the silver sneaker program. Discussed the importance of strengthening and occasional steroid injections if necessary. I do not feel she needs any further hyaluronic acid injections.  Follow-Up Instructions: Return if symptoms worsen or fail to improve.   Ortho Exam  Patient is alert, oriented, no adenopathy, well-dressed, normal affect, normal respiratory effort.  examination patient has an antalgic gait. She has good range of motion of her knees from 0 - 100. There is no effusion in either knee. She is minimally tender to palpation of the medial joint line of the left knee. There is minimal crepitation with range of motion of both knees. Patient's collaterals and cruciates are stable bilaterally her patella tracks midline.  Imaging: No results found.  Labs: No results found for: HGBA1C, ESRSEDRATE, CRP, LABURIC, REPTSTATUS, GRAMSTAIN, CULT, LABORGA  Orders:  No orders of the defined types were  placed in this encounter.  No orders of the defined types were placed in this encounter.    Procedures: No procedures performed  Clinical Data: No additional findings.  ROS:  All other systems negative, except as noted in the HPI. Review of Systems  Objective: Vital Signs: Ht 5\' 3"  (1.6 m)   Wt 162 lb (73.5 kg)   BMI 28.70 kg/m   Specialty Comments:  No specialty comments available.  PMFS History: Patient Active Problem List   Diagnosis Date Noted  . Varicose veins of left lower extremity with complications 81/82/9937   Past Medical History:  Diagnosis Date  . Glaucoma   . Hyperlipidemia   . Hypertension     Family History  Problem Relation Age of Onset  . Colon cancer Unknown     Past Surgical History:  Procedure Laterality Date  . BUNIONECTOMY Bilateral   . CARPAL TUNNEL RELEASE    . ENDOVENOUS ABLATION SAPHENOUS VEIN W/ LASER Left 08/30/2016   Endovenous laser ablation L GSV by Dr. Tinnie Gens  . REFRACTIVE SURGERY    . TONSILLECTOMY    . VARICOSE VEIN SURGERY     Social History   Occupational History  . Not on file.   Social History Main Topics  . Smoking status: Never Smoker  . Smokeless tobacco: Never Used  . Alcohol use 8.4 oz/week    14 Glasses of wine per week  . Drug use: Unknown  . Sexual activity: Not on file

## 2016-12-06 ENCOUNTER — Ambulatory Visit: Payer: Medicare Other | Admitting: Vascular Surgery

## 2016-12-09 ENCOUNTER — Encounter: Payer: Self-pay | Admitting: Vascular Surgery

## 2016-12-19 ENCOUNTER — Encounter: Payer: Self-pay | Admitting: Vascular Surgery

## 2016-12-19 ENCOUNTER — Ambulatory Visit (INDEPENDENT_AMBULATORY_CARE_PROVIDER_SITE_OTHER): Payer: Medicare Other | Admitting: Vascular Surgery

## 2016-12-19 VITALS — BP 152/73 | HR 67 | Temp 97.9°F | Resp 16 | Ht 63.5 in | Wt 166.0 lb

## 2016-12-19 DIAGNOSIS — I83892 Varicose veins of left lower extremities with other complications: Secondary | ICD-10-CM | POA: Diagnosis not present

## 2016-12-19 NOTE — Progress Notes (Signed)
Subjective:     Patient ID: Sheena Davis, female   DOB: [birth year], 76 y.o.   MRN: [id number]  HPI This 76 year old female returns 3 months post-laser ablation left great saphenous vein for gross reflux with painful varicosities. She had documented total closure of the left great saphenous vein on her 1 week post procedure ultrasound. She continues to have some hypersensitivity in the lower third of the left leg and some aching and throbbing discomfort particularly in the proximal medial calf. She does have mild edema. Her leg does feel better than prior to the laser ablation. She has tried long-leg elastic compression stockings with no improvement.  Past Medical History:  Diagnosis Date  . Glaucoma   . Hyperlipidemia   . Hypertension     Social History  Substance Use Topics  . Smoking status: Never Smoker  . Smokeless tobacco: Never Used  . Alcohol use 8.4 oz/week    14 Glasses of wine per week    Family History  Problem Relation Age of Onset  . Colon cancer Unknown     Allergies  Allergen Reactions  . Ampicillin     REACTION: bleeding  . Aspirin     REACTION: bleeding     Current Outpatient Prescriptions:  .  atenolol (TENORMIN) 50 MG tablet, Take 50 mg by mouth daily., Disp: , Rfl:  .  atorvastatin (LIPITOR) 20 MG tablet, Take 20 mg by mouth at bedtime., Disp: , Rfl:  .  CALCIUM PO, Take 1 tablet by mouth daily., Disp: , Rfl:  .  carteolol (OCUPRESS) 1 % ophthalmic solution, 1 drop 2 (two) times daily., Disp: , Rfl:  .  chlorthalidone (HYGROTON) 25 MG tablet, Take 12.5 mg by mouth daily., Disp: , Rfl:  .  COMBIGAN 0.2-0.5 % ophthalmic solution, INSTILL 1 DROP INTO BOTH EYES TWICE A DAY, Disp: , Rfl: 99 .  dorzolamide-timolol (COSOPT) 22.3-6.8 MG/ML ophthalmic solution, 1 drop 3 (three) times daily., Disp: , Rfl:  .  Glucosamine HCl (GLUCOSAMINE PO), Take 1 tablet by mouth daily., Disp: , Rfl:  .  latanoprost (XALATAN) 0.005 % ophthalmic solution, 1 drop at bedtime.,  Disp: , Rfl:  .  losartan (COZAAR) 50 MG tablet, Take 50 mg by mouth daily., Disp: , Rfl:  .  azithromycin (ZITHROMAX Z-PAK) 250 MG tablet, Take 1 tablet (250 mg total) by mouth daily. 500mg  PO day 1, then 250mg  PO days 205 (Patient not taking: Reported on 12/19/2016), Disp: 6 tablet, Rfl: 0  Vitals:   12/19/16 1321 12/19/16 1323  BP: (!) 152/72 (!) 152/73  Pulse: 67 67  Resp: 16   Temp: 97.9 F (36.6 C)   SpO2: 98%   Weight: 166 lb (75.3 kg)   Height: 5' 3.5" (1.613 m)     Body mass index is 28.94 kg/m.         Review of Systems Denies chest pain, dyspnea on exertion, PND, orthopnea, hemoptysis    Objective:   Physical Exam BP (!) 152/73 (BP Location: Left Arm, Patient Position: Sitting, Cuff Size: Normal)   Pulse 67   Temp 97.9 F (36.6 C)   Resp 16   Ht 5' 3.5" (1.613 m)   Wt 166 lb (75.3 kg)   SpO2 98%   BMI 28.94 kg/m   Gen. well-developed well-nourished female no apparent distress alert and oriented 3 Lungs no rhonchi or wheezing Left leg with bulging residual varicosities in the distal medial thigh and proximal medial calf with trace distal edema but no ulcer.  One small area of hyperpigmentation lower third left leg over great saphenous vein area     Assessment:     Residual painful varicosities left leg following laser ablation left great saphenous vein    Plan:     Patient should have multiple stab phlebectomy-10-20 of painful varicosities performed in the near future to complete her treatment regimen and relieve her symptoms We will proceed with precertification to perform that in the near future

## 2016-12-19 NOTE — Progress Notes (Signed)
Vitals:   12/19/16 1321  BP: (!) 152/72  Pulse: 67  Resp: 16  Temp: 97.9 F (36.6 C)  SpO2: 98%  Weight: 166 lb (75.3 kg)  Height: 5' 3.5" (1.613 m)

## 2017-01-02 ENCOUNTER — Encounter: Payer: Self-pay | Admitting: Vascular Surgery

## 2017-01-03 ENCOUNTER — Encounter: Payer: Self-pay | Admitting: Vascular Surgery

## 2017-01-03 ENCOUNTER — Ambulatory Visit (INDEPENDENT_AMBULATORY_CARE_PROVIDER_SITE_OTHER): Payer: Medicare Other | Admitting: Vascular Surgery

## 2017-01-03 VITALS — BP 123/71 | HR 65 | Temp 97.8°F | Resp 16 | Ht 63.5 in | Wt 166.0 lb

## 2017-01-03 DIAGNOSIS — I868 Varicose veins of other specified sites: Secondary | ICD-10-CM

## 2017-01-03 DIAGNOSIS — I83892 Varicose veins of left lower extremities with other complications: Secondary | ICD-10-CM

## 2017-01-03 HISTORY — PX: OTHER SURGICAL HISTORY: SHX169

## 2017-01-03 NOTE — Progress Notes (Signed)
Subjective:     Patient ID: Sheena Davis, female   DOB: 05-14-1941, 76 y.o.   MRN: [id number]  HPI This 76 year old female had multiple stab phlebectomy of painful varicosities-10-20 of the left leg performed under local tumescent anesthesia. She tolerated the procedure well.  Review of Systems     Objective:   Physical Exam BP 123/71 (BP Location: Left Arm, Patient Position: Sitting, Cuff Size: Normal)   Pulse 65   Temp 97.8 F (36.6 C) (Oral)   Resp 16   Ht 5' 3.5" (1.613 m)   Wt 166 lb (75.3 kg)   SpO2 97%   BMI 28.94 kg/m        Assessment:     Well-tolerated multiple stab phlebectomy painful varicosities left leg performed under local tumescent anesthesia    Plan:     Return in 3 months on a when necessary basis

## 2017-01-03 NOTE — Progress Notes (Signed)
    Stab Phlebectomy Procedure  Meron I Bevens DOB:01/16/1941  01/03/2017  Consent signed: Yes  Surgeon:J.D. Kellie Simmering, MD  Procedure: stab phlebectomy: left leg  BP 123/71 (BP Location: Left Arm, Patient Position: Sitting, Cuff Size: Normal)   Pulse 65   Temp 97.8 F (36.6 C) (Oral)   Resp 16   Ht 5' 3.5" (1.613 m)   Wt 166 lb (75.3 kg)   SpO2 97%   BMI 28.94 kg/m   Start time: 11:00AM   End time: 11:30AM   Tumescent Anesthesia: 200 cc 0.9% NaCl with 50 cc Lidocaine HCL with 1% Epi and 15 cc 8.4% NaHCO3  Local Anesthesia: 3 cc Lidocaine HCL and NaHCO3 (ratio 2:1)    Stab Phlebectomy: 10-20 Sites: Calf and Ankle  Patient tolerated procedure well: Yes    Description of Procedure:  After marking the course of the secondary varicosities, the patient was placed on the operating table in the supine position, and the left leg was prepped and draped in sterile fashion.    The patient was then put into Trendelenburg position.  Local anesthetic was administered at the previously marked varicosities, and tumescent anesthesia was administered around the vessels.  Ten to 20 stab wounds were made using the tip of an 11 blade. And using the vein hook, the phlebectomies were performed using a hemostat to avulse the varicosities.  Adequate hemostasis was achieved, and steri strips were applied to the stab wound.      ABD pads and thigh high compression stockings were applied as well ace wraps where needed. Blood loss was less than 15 cc.  The patient ambulated out of the operating room having tolerated the procedure well.

## 2017-01-05 ENCOUNTER — Encounter: Payer: Self-pay | Admitting: Vascular Surgery

## 2017-02-02 ENCOUNTER — Ambulatory Visit (INDEPENDENT_AMBULATORY_CARE_PROVIDER_SITE_OTHER): Payer: Medicare Other | Admitting: Orthopedic Surgery

## 2017-02-02 ENCOUNTER — Encounter (INDEPENDENT_AMBULATORY_CARE_PROVIDER_SITE_OTHER): Payer: Self-pay | Admitting: Orthopedic Surgery

## 2017-02-02 DIAGNOSIS — M170 Bilateral primary osteoarthritis of knee: Secondary | ICD-10-CM

## 2017-02-02 DIAGNOSIS — M65312 Trigger thumb, left thumb: Secondary | ICD-10-CM

## 2017-02-02 NOTE — Progress Notes (Signed)
   Office Visit Note   Patient: Sheena Davis           Date of Birth: 10-31-1940           MRN: [id number] Visit Date: 02/02/2017              Requested by: [name], MD [address] [address] [place], [place] [address] PCP: [name], MD  Chief Complaint  Patient presents with  . Left Knee - Pain      HPI: Patient presents for evaluation for osteoarthritis of her knees as well as triggering of the left thumb patient states she would like to proceed with hyaluronic acid injection.  Assessment & Plan: Visit Diagnoses:  1. Bilateral primary osteoarthritis of knee   2. Trigger thumb, left thumb     Plan: We will request authorization for hyaluronic acid injection discussed the possibility of trigger thumb release of the A1 pulley left thumb as an outpatient. Patient understands it'll probably take 1 month to get authorization for the injection.  Follow-Up Instructions: Return if symptoms worsen or fail to improve.   Ortho Exam  Patient is alert, oriented, no adenopathy, well-dressed, normal affect, normal respiratory effort. Examination patient has difficulty getting from a sitting to a standing position she has an antalgic gait varus alignment of both knees there is crepitation with range of motion of both knees she has pain with activities of daily living she has triggering of the left thumb at the A1 pulley.  Imaging: No results found. No images are attached to the encounter.  Labs: No results found for: HGBA1C, ESRSEDRATE, CRP, LABURIC, REPTSTATUS, GRAMSTAIN, CULT, LABORGA  Orders:  No orders of the defined types were placed in this encounter.  No orders of the defined types were placed in this encounter.    Procedures: No procedures performed  Clinical Data: No additional findings.  ROS:  All other systems negative, except as noted in the HPI. Review of Systems  Objective: Vital Signs: There were no vitals taken for this visit.  Specialty  Comments:  No specialty comments available.  PMFS History: Patient Active Problem List   Diagnosis Date Noted  . Trigger thumb, left thumb 02/02/2017  . Bilateral primary osteoarthritis of knee 02/02/2017  . Varicose veins of left lower extremity with complications 08/67/6195   Past Medical History:  Diagnosis Date  . Glaucoma   . Hyperlipidemia   . Hypertension     Family History  Problem Relation Age of Onset  . Colon cancer Unknown     Past Surgical History:  Procedure Laterality Date  . BUNIONECTOMY Bilateral   . CARPAL TUNNEL RELEASE    . ENDOVENOUS ABLATION SAPHENOUS VEIN W/ LASER Left 08/30/2016   Endovenous laser ablation L GSV by Dr. Tinnie Gens  . REFRACTIVE SURGERY    . stab phlebectomy Left 01/03/2017   stab phlebectomy 10-20 incisions left leg by Mariella Saa MD   . TONSILLECTOMY    . VARICOSE VEIN SURGERY     Social History   Occupational History  . Not on file.   Social History Main Topics  . Smoking status: Never Smoker  . Smokeless tobacco: Never Used  . Alcohol use 8.4 oz/week    14 Glasses of wine per week  . Drug use: Unknown  . Sexual activity: Not on file

## 2017-02-08 ENCOUNTER — Telehealth (INDEPENDENT_AMBULATORY_CARE_PROVIDER_SITE_OTHER): Payer: Self-pay | Admitting: Radiology

## 2017-02-08 ENCOUNTER — Telehealth (INDEPENDENT_AMBULATORY_CARE_PROVIDER_SITE_OTHER): Payer: Self-pay

## 2017-02-08 NOTE — Telephone Encounter (Signed)
Patient would like to know the status of PA for gel injection.  Cb# is [phone].  Please advise. Thank You.

## 2017-02-08 NOTE — Telephone Encounter (Signed)
Pending verification of benefits to return on this patient. Then will file buy and bill if available option.

## 2017-02-08 NOTE — Telephone Encounter (Signed)
I called patient advised that at 7:40pm of yesterday evening a fax was sent to our office advising they received her information and would have to determine what her eligibility was for the injection. It has been explained to patient on three separate occasions the process for this can take over a month for approval. I will call her with all updates.

## 2017-02-08 NOTE — Telephone Encounter (Signed)
-----   Message from Ernest, RT sent at 02/08/2017  8:26 AM EDT ----- Regarding: RE: Gel injections Awesome! Let's do this one buy and bill if the VOB says it is ok.  ----- Message ----- From: Maxcine Ham, RT Sent: 02/07/2017   2:15 PM To: Wendy May, RT Subject: FW: Gel injections                             Yes I'm working on this. Faxed to [employer] today. Advised patient by myself and Dr. Sharol Given the turn around time has recently been a month or longer for gel injections. She has [employer] She has tried synvisc in 2014 which per her office notes gave her very minimal relief.  ----- Message ----- From: May, Wendy, RT Sent: 02/07/2017   1:46 PM To: Maxcine Ham, RT Subject: Gel injections                                 I got this from [facility], is this one you are working on? Let me know if I can help with it, or if you want me to call patient.   Abigail Butts  Can you advised if anyone with Dr. Jess Barters staff is working on getting approval for Injection for patient Sheena Davis DOB  Aug 01, 1940 was in office 02/02/17 and Dr. Sharol Given advised her that thy would contact Insurance [facility]) for the approval for Left Knee Injections. Said not sure which gel though.  Ph #s for pt [phone] land line and cell [phone].  Thx Tisha

## 2017-02-10 ENCOUNTER — Telehealth (INDEPENDENT_AMBULATORY_CARE_PROVIDER_SITE_OTHER): Payer: Self-pay | Admitting: Orthopedic Surgery

## 2017-02-10 ENCOUNTER — Telehealth (INDEPENDENT_AMBULATORY_CARE_PROVIDER_SITE_OTHER): Payer: Self-pay

## 2017-02-10 NOTE — Telephone Encounter (Signed)
Letrice with my [employer] would like a call back concerning patient injection.  Cb# is [phone] ext.[phone]. Please advise.  Thank You.

## 2017-02-10 NOTE — Telephone Encounter (Signed)
Sheena Davis called and states that she would like to go ahead and schedule trigger thumb release that was discussed at her last appointment.  Can you please fill out a surgery sheet and I will call her back to discuss possible dates for surgery.

## 2017-02-13 NOTE — Telephone Encounter (Signed)
I called and spoke with Sheena Davis advised that they had received rx from our office. I advised that we are actually going to buy and bill. I called and spoke with patient advised her to come in tomorrow at 330pm.

## 2017-02-14 ENCOUNTER — Ambulatory Visit (INDEPENDENT_AMBULATORY_CARE_PROVIDER_SITE_OTHER): Payer: Medicare Other | Admitting: Orthopedic Surgery

## 2017-02-14 DIAGNOSIS — M1712 Unilateral primary osteoarthritis, left knee: Secondary | ICD-10-CM

## 2017-02-14 DIAGNOSIS — M65312 Trigger thumb, left thumb: Secondary | ICD-10-CM | POA: Diagnosis not present

## 2017-02-14 DIAGNOSIS — M170 Bilateral primary osteoarthritis of knee: Secondary | ICD-10-CM

## 2017-02-14 MED ORDER — LIDOCAINE HCL 1 % IJ SOLN
5.0000 mL | INTRAMUSCULAR | Status: AC | PRN
  Administered 2017-02-14: 5 mL

## 2017-02-14 MED ORDER — HYALURONAN 88 MG/4ML IX SOSY
88.0000 mg | PREFILLED_SYRINGE | INTRA_ARTICULAR | Status: AC | PRN
  Administered 2017-02-14: 88 mg via INTRA_ARTICULAR

## 2017-02-14 NOTE — Progress Notes (Signed)
Office Visit Note   Patient: Sheena Davis           Date of Birth: Jan 20, 1941           MRN: [id number] Visit Date: 02/14/2017              Requested by: [name], MD [address] [place] [address] [place], [place] [address] PCP: [name], MD  No chief complaint on file.     HPI: Patient is a 76 year old woman who presents for 2 separate issues She's having increasing pain with triggering of the left thumb she states she cannot use her hand for activities of daily living due to painful triggering she cannot open a door. Patient states that her knee has been hurting more and she has had interval relief with previous hyaluronic acid injections.  Assessment & Plan: Visit Diagnoses:  1. Bilateral primary osteoarthritis of knee   2. Trigger thumb, left thumb     Plan: Left knee was injected from the anterior medial portal patient states that she would like to proceed with surgical intervention for the triggering of the left thumb we'll plan for release the A1 pulleys and outpatient with [facility] anesthetic plus local. Follow-up in the office 1 week after surgery.  Follow-Up Instructions: Return if symptoms worsen or fail to improve.   Ortho Exam  Patient is alert, oriented, no adenopathy, well-dressed, normal affect, normal respiratory effort. Examination patient has an antalgic gait. There is no redness or swelling around the left knee. The left knee was injected with the hyaluronic acid without complications. Examination the thumb she has palpable triggering at the A1 pulley there is no pain through the carpal metacarpal joint. First dorsal extensor compartment is nontender.  Imaging: No results found. No images are attached to the encounter.  Labs: No results found for: HGBA1C, ESRSEDRATE, CRP, LABURIC, REPTSTATUS, GRAMSTAIN, CULT, LABORGA  Orders:  No orders of the defined types were placed in this encounter.  No orders of the defined types were placed in this  encounter.    Procedures: Large Joint Inj Date/Time: 02/14/2017 4:18 PM Performed by: Markisha Meding V Authorized by: Newt Minion   Consent Given by:  Patient Site marked: the procedure site was marked   Timeout: prior to procedure the correct patient, procedure, and site was verified   Indications:  Pain and diagnostic evaluation Location:  Knee Site:  L knee Prep: patient was prepped and draped in usual sterile fashion   Needle Size:  22 G Needle Length:  1.5 inches Approach:  Anteromedial Ultrasound Guidance: No   Fluoroscopic Guidance: No   Arthrogram: No   Medications:  5 mL lidocaine 1 %; 88 mg Hyaluronan 88 MG/4ML Aspiration Attempted: No   Patient tolerance:  Patient tolerated the procedure well with no immediate complications    Clinical Data: No additional findings.  ROS:  All other systems negative, except as noted in the HPI. Review of Systems  Objective: Vital Signs: There were no vitals taken for this visit.  Specialty Comments:  No specialty comments available.  PMFS History: Patient Active Problem List   Diagnosis Date Noted  . Trigger thumb, left thumb 02/02/2017  . Bilateral primary osteoarthritis of knee 02/02/2017  . Varicose veins of left lower extremity with complications 19/14/7829   Past Medical History:  Diagnosis Date  . Glaucoma   . Hyperlipidemia   . Hypertension     Family History  Problem Relation Age of Onset  . Colon cancer  Unknown     Past Surgical History:  Procedure Laterality Date  . BUNIONECTOMY Bilateral   . CARPAL TUNNEL RELEASE    . ENDOVENOUS ABLATION SAPHENOUS VEIN W/ LASER Left 08/30/2016   Endovenous laser ablation L GSV by Dr. Tinnie Gens  . REFRACTIVE SURGERY    . stab phlebectomy Left 01/03/2017   stab phlebectomy 10-20 incisions left leg by Mariella Saa MD   . TONSILLECTOMY    . VARICOSE VEIN SURGERY     Social History   Occupational History  . Not on file.   Social History Main Topics  .  Smoking status: Never Smoker  . Smokeless tobacco: Never Used  . Alcohol use 8.4 oz/week    14 Glasses of wine per week  . Drug use: Unknown  . Sexual activity: Not on file

## 2017-02-28 DIAGNOSIS — M65312 Trigger thumb, left thumb: Secondary | ICD-10-CM | POA: Diagnosis not present

## 2017-03-07 ENCOUNTER — Ambulatory Visit (INDEPENDENT_AMBULATORY_CARE_PROVIDER_SITE_OTHER): Payer: Medicare Other | Admitting: Orthopedic Surgery

## 2017-03-07 ENCOUNTER — Encounter (INDEPENDENT_AMBULATORY_CARE_PROVIDER_SITE_OTHER): Payer: Self-pay | Admitting: Orthopedic Surgery

## 2017-03-07 DIAGNOSIS — M65312 Trigger thumb, left thumb: Secondary | ICD-10-CM

## 2017-03-07 NOTE — Progress Notes (Signed)
   Office Visit Note   Patient: Sheena Davis           Date of Birth: 23-Dec-1940           MRN: [id number] Visit Date: 03/07/2017              Requested by: [name], MD [address] [place], [place] [address] PCP: [name], MD  Chief Complaint  Patient presents with  . Left Thumb - Routine Post Op      HPI: Patient is a 76 year old woman presents 1 week status post A1 pulley release for triggering of her left thumb  Assessment & Plan: Visit Diagnoses:  1. Trigger thumb, left thumb     Plan: she was given instructions for scar massage and range of motion exercises. Patient states she would like to follow-up as needed.  Follow-Up Instructions: Return if symptoms worsen or fail to improve.   Ortho Exam  Patient is alert, oriented, no adenopathy, well-dressed, normal affect, normal respiratory effort. Examination the incision is well-healed there is no redness no cellulitis no drainage. The operative findings were reviewed which she had some fraying of her flexor tendon which was getting caught under the A1 pulley. Patient is asymptomatic at this time with range of motion of her thumb.we will harvest the sutures.  Imaging: No results found. No images are attached to the encounter.  Labs: No results found for: HGBA1C, ESRSEDRATE, CRP, LABURIC, REPTSTATUS, GRAMSTAIN, CULT, LABORGA  Orders:  No orders of the defined types were placed in this encounter.  No orders of the defined types were placed in this encounter.    Procedures: No procedures performed  Clinical Data: No additional findings.  ROS:  All other systems negative, except as noted in the HPI. Review of Systems  Objective: Vital Signs: There were no vitals taken for this visit.  Specialty Comments:  No specialty comments available.  PMFS History: Patient Active Problem List   Diagnosis Date Noted  . Trigger thumb, left thumb 02/02/2017  . Bilateral primary osteoarthritis  of knee 02/02/2017  . Varicose veins of left lower extremity with complications 94/49/6759   Past Medical History:  Diagnosis Date  . Glaucoma   . Hyperlipidemia   . Hypertension     Family History  Problem Relation Age of Onset  . Colon cancer Unknown     Past Surgical History:  Procedure Laterality Date  . BUNIONECTOMY Bilateral   . CARPAL TUNNEL RELEASE    . ENDOVENOUS ABLATION SAPHENOUS VEIN W/ LASER Left 08/30/2016   Endovenous laser ablation L GSV by Dr. Tinnie Gens  . REFRACTIVE SURGERY    . stab phlebectomy Left 01/03/2017   stab phlebectomy 10-20 incisions left leg by Mariella Saa MD   . TONSILLECTOMY    . VARICOSE VEIN SURGERY     Social History   Occupational History  . Not on file.   Social History Main Topics  . Smoking status: Never Smoker  . Smokeless tobacco: Never Used  . Alcohol use 8.4 oz/week    14 Glasses of wine per week  . Drug use: Unknown  . Sexual activity: Not on file

## 2017-04-07 ENCOUNTER — Ambulatory Visit: Payer: Medicare Other | Admitting: Podiatry

## 2017-04-07 ENCOUNTER — Other Ambulatory Visit: Payer: Self-pay | Admitting: Podiatry

## 2017-04-07 ENCOUNTER — Encounter: Payer: Self-pay | Admitting: Podiatry

## 2017-04-07 ENCOUNTER — Ambulatory Visit (INDEPENDENT_AMBULATORY_CARE_PROVIDER_SITE_OTHER): Payer: Medicare Other

## 2017-04-07 DIAGNOSIS — M79671 Pain in right foot: Secondary | ICD-10-CM

## 2017-04-07 DIAGNOSIS — L84 Corns and callosities: Secondary | ICD-10-CM

## 2017-04-07 DIAGNOSIS — M779 Enthesopathy, unspecified: Secondary | ICD-10-CM

## 2017-04-07 DIAGNOSIS — M21619 Bunion of unspecified foot: Secondary | ICD-10-CM | POA: Diagnosis not present

## 2017-04-07 MED ORDER — TRIAMCINOLONE ACETONIDE 10 MG/ML IJ SUSP
10.0000 mg | Freq: Once | INTRAMUSCULAR | Status: AC
  Administered 2017-04-07: 10 mg

## 2017-04-12 NOTE — Progress Notes (Signed)
Subjective:    Patient ID: Sheena Davis, female   DOB: 76 y.o.   MRN: [id number]   HPI patient presents with significant lesion underneath the first metatarsal right that has fluid in it with inflammation and pain when palpated and also has structural bunion that she's concerned about. Patient does not smoke currently likes to be active but is not able to be as active and she wants    Review of Systems  All other systems reviewed and are negative.       Objective:  Physical Exam  Constitutional: She appears well-developed and well-nourished.  Cardiovascular: Intact distal pulses.  Musculoskeletal: Normal range of motion.  Neurological: She is alert.  Skin: Skin is warm and dry.  Nursing note and vitals reviewed.  neurovascular status intact muscle strength adequate range of motion within normal limits with patient noted to have exquisite pain plantar aspect first metatarsal with keratotic lesion formation and fluid buildup. Patient has structural bunion deformity noted with inflammation and pain around the joint surface and was found to have good digital perfusion and is well oriented 3     Assessment:   Inflammatory capsulitis sub-first MPJ right with lesion formation and pain      Plan:   H&P condition reviewed and explained. Today I went ahead and I injected the plantar capsule of the first MPJ 3 mg Dexon some Kenalog 5 mg Xylocaine and did deep debridement of lesion and went ahead and advised on wider shoes. Reappoint as symptoms indicate  X-rays indicate no indications that there is any kind of hypertrophy within the area with structural bunion deformity noted

## 2017-05-11 ENCOUNTER — Ambulatory Visit: Payer: Medicare Other | Admitting: Podiatry

## 2017-05-11 ENCOUNTER — Encounter: Payer: Self-pay | Admitting: Podiatry

## 2017-05-11 DIAGNOSIS — L84 Corns and callosities: Secondary | ICD-10-CM | POA: Diagnosis not present

## 2017-05-11 DIAGNOSIS — M79674 Pain in right toe(s): Secondary | ICD-10-CM

## 2017-05-11 DIAGNOSIS — B351 Tinea unguium: Secondary | ICD-10-CM

## 2017-05-11 DIAGNOSIS — M79675 Pain in left toe(s): Secondary | ICD-10-CM

## 2017-05-11 NOTE — Progress Notes (Signed)
Subjective:   Patient ID: Sheena Davis, female   DOB: 76 y.o.   MRN: [id number]   HPI Patient presents as lesion is still really bother me on my right foot and my nails have really become increasingly hard for me to walk with   ROS      Objective:  Physical Exam  Neurovascular status intact with thick yellow brittle nailbeds 1-5 both feet that are incurvated and lesion formation right     Assessment:  Painful mycotic nails that are incurvated and sore 1-5 both feet along with lesion right     Plan:  H&P condition reviewed and debrided nailbeds 1-5 both feet with no iatrogenic bleeding and debrided lesion right no iatrogenic bleeding and reappoint for routine care

## 2017-05-12 ENCOUNTER — Ambulatory Visit: Payer: Medicare Other | Admitting: Podiatry

## 2017-06-20 ENCOUNTER — Telehealth (INDEPENDENT_AMBULATORY_CARE_PROVIDER_SITE_OTHER): Payer: Self-pay | Admitting: Orthopedic Surgery

## 2017-06-20 NOTE — Telephone Encounter (Signed)
I advised that needs to be 6 months apart, for monovisc injection. Advised we could schedule for regular cortisone. Went ahead an got her on for March 4th with Dr. Sharol Given to give [place] time to reapply for monovisc, no changes in insurance.

## 2017-06-20 NOTE — Telephone Encounter (Signed)
Patient called wanting to know if she could schedule an appointment to get another monovisc injection. CB # [phone]

## 2017-06-29 ENCOUNTER — Telehealth (INDEPENDENT_AMBULATORY_CARE_PROVIDER_SITE_OTHER): Payer: Self-pay

## 2017-06-29 NOTE — Telephone Encounter (Signed)
Talked with patient and advised her that she is approved for Monovisc Inj.for left knee and we can [employer].  No PA required for Medical or Pharmacy.  Appt.scheduled for 08/15/17.

## 2017-07-24 ENCOUNTER — Ambulatory Visit (INDEPENDENT_AMBULATORY_CARE_PROVIDER_SITE_OTHER): Payer: Medicare Other | Admitting: Orthopedic Surgery

## 2017-08-11 ENCOUNTER — Ambulatory Visit (INDEPENDENT_AMBULATORY_CARE_PROVIDER_SITE_OTHER): Payer: Medicare Other | Admitting: Podiatry

## 2017-08-11 DIAGNOSIS — L84 Corns and callosities: Secondary | ICD-10-CM | POA: Diagnosis not present

## 2017-08-11 DIAGNOSIS — M79674 Pain in right toe(s): Secondary | ICD-10-CM

## 2017-08-11 DIAGNOSIS — B351 Tinea unguium: Secondary | ICD-10-CM | POA: Diagnosis not present

## 2017-08-11 DIAGNOSIS — M79675 Pain in left toe(s): Secondary | ICD-10-CM | POA: Diagnosis not present

## 2017-08-15 ENCOUNTER — Encounter (INDEPENDENT_AMBULATORY_CARE_PROVIDER_SITE_OTHER): Payer: Self-pay | Admitting: Orthopedic Surgery

## 2017-08-15 ENCOUNTER — Ambulatory Visit (INDEPENDENT_AMBULATORY_CARE_PROVIDER_SITE_OTHER): Payer: Medicare Other | Admitting: Orthopedic Surgery

## 2017-08-15 DIAGNOSIS — M1712 Unilateral primary osteoarthritis, left knee: Secondary | ICD-10-CM

## 2017-08-15 DIAGNOSIS — M170 Bilateral primary osteoarthritis of knee: Secondary | ICD-10-CM

## 2017-08-15 MED ORDER — LIDOCAINE HCL 1 % IJ SOLN
1.0000 mL | INTRAMUSCULAR | Status: AC | PRN
  Administered 2017-08-15: 1 mL

## 2017-08-15 MED ORDER — HYALURONAN 88 MG/4ML IX SOSY
88.0000 mg | PREFILLED_SYRINGE | INTRA_ARTICULAR | Status: AC | PRN
  Administered 2017-08-15: 88 mg via INTRA_ARTICULAR

## 2017-08-15 NOTE — Progress Notes (Addendum)
Office Visit Note   Patient: Sheena Davis           Date of Birth: 02/01/1941           MRN: [id number] Visit Date: 08/15/2017              Requested by: [name], MD [address] [address] [place], [place] [address] PCP: [name], MD  Chief Complaint  Patient presents with  . Left Knee - Follow-up    Here for Monovisc injection---Buy and Bill      HPI: Patient is a 77 year old woman who has had temporary relief with steroid and hyaluronic acid injections in the past and presents at this time for a hyaluronic injection.  Patient states that her knee pain in the left side is gotten worse over the past several months she is moving slower and hobbling.  Assessment & Plan: Visit Diagnoses:  1. Bilateral primary osteoarthritis of knee     Plan: The left knee was injected with hyaluronic acid injection Monovisc from the anterior medial portal she tolerated this well follow-up as needed.  Follow-Up Instructions: Return if symptoms worsen or fail to improve.   Ortho Exam  Patient is alert, oriented, no adenopathy, well-dressed, normal affect, normal respiratory effort. Examination patient has an antalgic gait with pain with weightbearing on the left leg.  She has tenderness to palpation over the medial and lateral joint line collaterals and cruciates are stable she does have a moderate effusion.  After informed consent the left knee was injected with Monovisc.  Imaging: No results found. No images are attached to the encounter.  Labs: No results found for: HGBA1C, ESRSEDRATE, CRP, LABURIC, REPTSTATUS, GRAMSTAIN, CULT, LABORGA  @LABSALLVALUES (HGBA1)@  There is no height or weight on file to calculate BMI.  Orders:  Orders Placed This Encounter  Procedures  . Large Joint Inj: L knee   Meds ordered this encounter  Medications  . lidocaine (XYLOCAINE) 1 % (with pres) injection 1 mL     Procedures: Large Joint Inj: L knee on 08/15/2017 12:51  PM Indications: pain and diagnostic evaluation Details: 22 G 1.5 in needle, anteromedial approach  Arthrogram: No  Medications: 1 mL lidocaine 1 %; 88 mg Hyaluronan 88 MG/4ML Outcome: tolerated well, no immediate complications Procedure, treatment alternatives, risks and benefits explained, specific risks discussed. Consent was given by the patient. Immediately prior to procedure a time out was called to verify the correct patient, procedure, equipment, support staff and site/side marked as required. Patient was prepped and draped in the usual sterile fashion.      Clinical Data: No additional findings.  ROS:  All other systems negative, except as noted in the HPI. Review of Systems  Objective: Vital Signs: There were no vitals taken for this visit.  Specialty Comments:  No specialty comments available.  PMFS History: Patient Active Problem List   Diagnosis Date Noted  . Trigger thumb, left thumb 02/02/2017  . Bilateral primary osteoarthritis of knee 02/02/2017  . Varicose veins of left lower extremity with complications 62/13/0865   Past Medical History:  Diagnosis Date  . Glaucoma   . Hyperlipidemia   . Hypertension     Family History  Problem Relation Age of Onset  . Colon cancer Unknown     Past Surgical History:  Procedure Laterality Date  . BUNIONECTOMY Bilateral   . CARPAL TUNNEL RELEASE    . ENDOVENOUS ABLATION SAPHENOUS VEIN W/ LASER Left 08/30/2016   Endovenous laser ablation L  GSV by Dr. Tinnie Gens  . REFRACTIVE SURGERY    . stab phlebectomy Left 01/03/2017   stab phlebectomy 10-20 incisions left leg by Mariella Saa MD   . TONSILLECTOMY    . VARICOSE VEIN SURGERY     Social History   Occupational History  . Not on file  Tobacco Use  . Smoking status: Never Smoker  . Smokeless tobacco: Never Used  Substance and Sexual Activity  . Alcohol use: Yes    Alcohol/week: 8.4 oz    Types: 14 Glasses of wine per week  . Drug use: Not on file  .  Sexual activity: Not on file

## 2017-08-16 NOTE — Progress Notes (Signed)
Subjective:   Patient ID: Sheena Davis, female   DOB: 77 y.o.   MRN: [id number]   HPI Patient presents with painful nailbeds 1-5 both feet that are thick and incurvated and lesion formation that makes walking difficult.  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

## 2017-10-25 ENCOUNTER — Telehealth: Payer: Self-pay | Admitting: Podiatry

## 2017-10-25 NOTE — Telephone Encounter (Signed)
Left message informing pt that she could call our office to discuss and schedule surgery with Dr. Paulla Dolly, and if she had the corn shaved before the 61 days it would not be covered by the insurance.

## 2017-10-25 NOTE — Telephone Encounter (Signed)
Pt wanted to come in for an appointment with Dr. Paulla Dolly to get an appointment to get corn shaved down. I explained if she can in before time that she will be responsible for paying the cost because insurance will not pay 61 and before. She stated that she would like this corn surgically removed and would that be possible. Please give her a call back.

## 2017-10-30 ENCOUNTER — Ambulatory Visit (INDEPENDENT_AMBULATORY_CARE_PROVIDER_SITE_OTHER): Payer: Medicare Other | Admitting: Orthopedic Surgery

## 2017-10-30 ENCOUNTER — Ambulatory Visit (INDEPENDENT_AMBULATORY_CARE_PROVIDER_SITE_OTHER): Payer: Medicare Other

## 2017-10-30 ENCOUNTER — Encounter (INDEPENDENT_AMBULATORY_CARE_PROVIDER_SITE_OTHER): Payer: Self-pay | Admitting: Orthopedic Surgery

## 2017-10-30 VITALS — Ht 64.0 in | Wt 166.0 lb

## 2017-10-30 DIAGNOSIS — M1711 Unilateral primary osteoarthritis, right knee: Secondary | ICD-10-CM | POA: Diagnosis not present

## 2017-10-30 DIAGNOSIS — M170 Bilateral primary osteoarthritis of knee: Secondary | ICD-10-CM

## 2017-10-30 DIAGNOSIS — M1712 Unilateral primary osteoarthritis, left knee: Secondary | ICD-10-CM

## 2017-10-30 DIAGNOSIS — M25561 Pain in right knee: Secondary | ICD-10-CM | POA: Diagnosis not present

## 2017-10-30 NOTE — Progress Notes (Signed)
Office Visit Note   Patient: Sheena Davis           Date of Birth: [birth year]           MRN: [id number] Visit Date: 10/30/2017              Requested by: [name], MD [address] [place], [place] [address] PCP: [name], MD  Chief Complaint  Patient presents with  . Right Knee - Pain      HPI: Patient is a 77 year old woman with osteoarthritis both knees presents at this time with worsening arthritic pain in her right knee.  She states the left knee just feels stiff and aching denies any swelling she complains of pain with walking pain going down stairs.  Assessment & Plan: Visit Diagnoses:  1. Right knee pain, unspecified chronicity   2. Bilateral primary osteoarthritis of knee     Plan: The right knee was injected she tolerated this well plan to follow-up as needed.  Discussed close chain kinetic exercises for strengthening.  Discussed continue injections for temporary relief and the possibility for a total knee replacement if necessary.  Follow-Up Instructions: Return if symptoms worsen or fail to improve.   Ortho Exam  Patient is alert, oriented, no adenopathy, well-dressed, normal affect, normal respiratory effort. Examination patient has an antalgic gait with varus alignment to both knees she is tender to palpation over the medial joint line bilaterally there is worse crepitation of the right knee compared to the left collaterals and cruciates are stable.  Imaging: Xr Knee 3 View Right  Result Date: 10/30/2017 3 view radiographs of the right knee does show significant tricompartmental osteoarthritis with large periarticular bony spurs subcondylar sclerosis and subcondylar cysts.  No images are attached to the encounter.  Labs: No results found for: HGBA1C, ESRSEDRATE, CRP, LABURIC, REPTSTATUS, GRAMSTAIN, CULT, LABORGA   No results found for: ALBUMIN, PREALBUMIN, LABURIC  Body mass index is 28.49 kg/m.  Orders:  Orders Placed This  Encounter  Procedures  . Large Joint Inj  . XR KNEE 3 VIEW RIGHT   No orders of the defined types were placed in this encounter.    Procedures: Large Joint Inj: R knee on 10/30/2017 3:14 PM Details: anteromedial approach     Clinical Data: No additional findings.  ROS:  All other systems negative, except as noted in the HPI. Review of Systems  Objective: Vital Signs: Ht 5\' 4"  (1.626 m)   Wt 166 lb (75.3 kg)   BMI 28.49 kg/m   Specialty Comments:  No specialty comments available.  PMFS History: Patient Active Problem List   Diagnosis Date Noted  . Trigger thumb, left thumb 02/02/2017  . Bilateral primary osteoarthritis of knee 02/02/2017  . Varicose veins of left lower extremity with complications 45/80/9983   Past Medical History:  Diagnosis Date  . Glaucoma   . Hyperlipidemia   . Hypertension     Family History  Problem Relation Age of Onset  . Colon cancer Unknown     Past Surgical History:  Procedure Laterality Date  . BUNIONECTOMY Bilateral   . CARPAL TUNNEL RELEASE    . ENDOVENOUS ABLATION SAPHENOUS VEIN W/ LASER Left 08/30/2016   Endovenous laser ablation L GSV by Dr. Tinnie Gens  . REFRACTIVE SURGERY    . stab phlebectomy Left 01/03/2017   stab phlebectomy 10-20 incisions left leg by Mariella Saa MD   . TONSILLECTOMY    . VARICOSE VEIN SURGERY  Social History   Occupational History  . Not on file  Tobacco Use  . Smoking status: Never Smoker  . Smokeless tobacco: Never Used  Substance and Sexual Activity  . Alcohol use: Yes    Alcohol/week: 8.4 oz    Types: 14 Glasses of wine per week  . Drug use: Not on file  . Sexual activity: Not on file

## 2017-11-01 ENCOUNTER — Ambulatory Visit: Payer: Medicare Other | Admitting: Podiatry

## 2017-11-01 ENCOUNTER — Encounter: Payer: Self-pay | Admitting: Podiatry

## 2017-11-01 DIAGNOSIS — M79675 Pain in left toe(s): Secondary | ICD-10-CM

## 2017-11-01 DIAGNOSIS — L84 Corns and callosities: Secondary | ICD-10-CM

## 2017-11-01 DIAGNOSIS — M79674 Pain in right toe(s): Secondary | ICD-10-CM

## 2017-11-01 DIAGNOSIS — B351 Tinea unguium: Secondary | ICD-10-CM

## 2017-11-01 NOTE — Progress Notes (Signed)
Subjective:   Patient ID: Sheena Davis, female   DOB: 77 y.o.   MRN: [id number]   HPI Patient presents with significant incurvation of nailbeds 1-5 both feet that are thick and become painful with lesion formation underneath the first metatarsal right that is painful with palpation   ROS      Objective:  Physical Exam  Neurovascular status intact with thick yellow brittle nailbeds 1-5 both feet that are painful and lesion formation sub-right first metatarsal that is painful when pressed and makes walking difficult     Assessment:  Mycotic nail infection with pain 1-5 both feet and lesion sub-first metatarsal right painful     Plan:  H&P condition reviewed and debridement of nailbeds 1-5 both feet with no iatrogenic bleeding accomplished and debridement of lesion right with no iatrogenic bleeding and will return for routine care or earlier if needed

## 2017-11-13 ENCOUNTER — Telehealth (INDEPENDENT_AMBULATORY_CARE_PROVIDER_SITE_OTHER): Payer: Self-pay | Admitting: Orthopedic Surgery

## 2017-11-13 ENCOUNTER — Other Ambulatory Visit: Payer: Medicare Other

## 2017-11-13 NOTE — Telephone Encounter (Signed)
Patient called asked if she can get a Monovisc injection in her left knee? The number to contact patient is [phone]

## 2017-11-13 NOTE — Telephone Encounter (Signed)
Called to advise that she had the injection 08/15/17 she would not be able to have a repeat injection until 02/15/18 she will call back closer to that time.

## 2018-01-08 ENCOUNTER — Telehealth (INDEPENDENT_AMBULATORY_CARE_PROVIDER_SITE_OTHER): Payer: Self-pay

## 2018-01-08 NOTE — Telephone Encounter (Signed)
Talked with patient and advised her that it has to be 6 months from her last Monovisc injection which was on 08/15/2017.  Advised patient that she will be able to have her next gel injection after 02/15/2018.  Patient stated that she understands and will CB closer to that date.

## 2018-01-19 ENCOUNTER — Ambulatory Visit: Payer: Medicare Other | Admitting: Podiatry

## 2018-01-19 DIAGNOSIS — B351 Tinea unguium: Secondary | ICD-10-CM | POA: Diagnosis not present

## 2018-01-19 DIAGNOSIS — M79675 Pain in left toe(s): Secondary | ICD-10-CM

## 2018-01-19 DIAGNOSIS — L84 Corns and callosities: Secondary | ICD-10-CM

## 2018-01-19 DIAGNOSIS — M79674 Pain in right toe(s): Secondary | ICD-10-CM

## 2018-01-22 ENCOUNTER — Encounter: Payer: Self-pay | Admitting: Podiatry

## 2018-01-22 NOTE — Progress Notes (Signed)
Subjective: Sheena Davis presents today withcc of painful, discolored, thick toenails which interfere with daily activities and routine tasks. Pain is aggravated when wearing enclosed shoe gear. Pain is getting progressively worse. Patient desires routine professional help regarding symptoms.  Objective: Vascular Examination: Capillary refill time <3 seconds x 10 digits Dorsalis pedis and posterior tibial pulses present b/l No digital hair x 10 digits Skin temperature warm to warm b/l  Dermatological Examination: Skin with normal turgor, texture and tone Toenails 1-5 b/l thick, elongated, discolored with subungual debris  Hyperkeratotic lesion submet head 1 right foot with tenderness to palpation  Musculoskeletal: Muscle strength 5/5 to all LE muscle groups  Neurological: Sensation intact with 10 gram monofilament. Vibratory sensation intact.  Assessment: Painful mycotic toenails x 10  Painful callus submet head 1 right foot  Plan: 1. Patient to continue soft, supportive shoe gear 2. Toenails 1-5 b/l were debrided in length and girth 3. Callus debrided submet 1 right foot 4. Patient to report any pedal injuries to medical professional immediately. 5. Follow up 3 months. Patient/POA to call should there be a concern in the interim.

## 2018-01-25 ENCOUNTER — Telehealth (INDEPENDENT_AMBULATORY_CARE_PROVIDER_SITE_OTHER): Payer: Self-pay | Admitting: Orthopedic Surgery

## 2018-01-25 NOTE — Telephone Encounter (Signed)
Noted  

## 2018-01-25 NOTE — Telephone Encounter (Signed)
Looks like you had already spoke with this pt about getting her gel injection. She will be eligible on 02/15/18 and wants to make an appt for this can you submit paperwork for her?

## 2018-01-25 NOTE — Telephone Encounter (Signed)
Patient called needing to get an  monovisc injection in her left knee. Patient asked when can she be scheduled for an appointment. Patient is aware that the injection would need to be approved prior to her appointment. The  Number to contact patient is [phone]

## 2018-01-26 ENCOUNTER — Telehealth (INDEPENDENT_AMBULATORY_CARE_PROVIDER_SITE_OTHER): Payer: Self-pay

## 2018-01-26 NOTE — Telephone Encounter (Signed)
Submitted VOB for SynviscOne, left knee. 

## 2018-01-29 ENCOUNTER — Telehealth (INDEPENDENT_AMBULATORY_CARE_PROVIDER_SITE_OTHER): Payer: Self-pay

## 2018-01-29 NOTE — Telephone Encounter (Signed)
Talked with patient and advised her that she is approved for SynviscOne, left knee. Buy & Bill Covered at 100% after co-pay Co-pay $90.00 No PA required  Appt.scheduled 02/19/2018

## 2018-01-31 ENCOUNTER — Other Ambulatory Visit: Payer: Medicare Other

## 2018-02-19 ENCOUNTER — Ambulatory Visit (INDEPENDENT_AMBULATORY_CARE_PROVIDER_SITE_OTHER): Payer: Medicare Other | Admitting: Orthopedic Surgery

## 2018-02-19 ENCOUNTER — Encounter (INDEPENDENT_AMBULATORY_CARE_PROVIDER_SITE_OTHER): Payer: Self-pay | Admitting: Orthopedic Surgery

## 2018-02-19 VITALS — Ht 64.0 in | Wt 166.0 lb

## 2018-02-19 DIAGNOSIS — M1712 Unilateral primary osteoarthritis, left knee: Secondary | ICD-10-CM

## 2018-02-19 DIAGNOSIS — M1711 Unilateral primary osteoarthritis, right knee: Secondary | ICD-10-CM

## 2018-02-19 DIAGNOSIS — M170 Bilateral primary osteoarthritis of knee: Secondary | ICD-10-CM

## 2018-02-19 MED ORDER — HYLAN G-F 20 48 MG/6ML IX SOSY
48.0000 mg | PREFILLED_SYRINGE | INTRA_ARTICULAR | Status: AC | PRN
  Administered 2018-02-19: 48 mg via INTRA_ARTICULAR

## 2018-02-19 NOTE — Progress Notes (Signed)
Office Visit Note   Patient: Sheena Davis           Date of Birth: 04-10-1941           MRN: [id number] Visit Date: 02/19/2018              Requested by: [name], MD [address] [place], [place] [address] PCP: [name], MD  Chief Complaint  Patient presents with  . Left Knee - Pain    Synovisc Inj       HPI: Patient is a 77 year old woman who presents for a hyaluronic acid injection for her left knee she states she is also started having increasing pain in her right knee.  Assessment & Plan: Visit Diagnoses:  1. Bilateral primary osteoarthritis of knee     Plan: Left knee was injected without complications with Synvisc 1.  Patient will use ice tonight and start back on stationary bicycle strengthening for both lower extremities.  Follow-Up Instructions: No follow-ups on file.   Ortho Exam  Patient is alert, oriented, no adenopathy, well-dressed, normal affect, normal respiratory effort. Examination patient has an antalgic gait worse on the left than the right.  She has more crepitation in the left knee than the right knee collaterals and cruciates are stable bilaterally.  Medial lateral joint lines are minimally tender to palpation of the right she is maximal tenderness palpation of the medial and lateral joint lines of the left knee.  Patellofemoral joint is minimally tender to palpation on the right and has more tenderness to palpation on the left.  There is no effusion of either knee no redness or cellulitis.  Imaging: No results found. No images are attached to the encounter.  Labs: No results found for: HGBA1C, ESRSEDRATE, CRP, LABURIC, REPTSTATUS, GRAMSTAIN, CULT, LABORGA   No results found for: ALBUMIN, PREALBUMIN, LABURIC  Body mass index is 28.49 kg/m.  Orders:  Orders Placed This Encounter  Procedures  . Large Joint Inj   No orders of the defined types were placed in this encounter.    Procedures: Large Joint Inj: L knee  on 02/19/2018 2:02 PM Indications: pain and diagnostic evaluation Details: 22 G 1.5 in needle, anteromedial approach  Arthrogram: No  Medications: 48 mg Hylan 48 MG/6ML Outcome: tolerated well, no immediate complications Procedure, treatment alternatives, risks and benefits explained, specific risks discussed. Consent was given by the patient. Immediately prior to procedure a time out was called to verify the correct patient, procedure, equipment, support staff and site/side marked as required. Patient was prepped and draped in the usual sterile fashion.      Clinical Data: No additional findings.  ROS:  All other systems negative, except as noted in the HPI. Review of Systems  Objective: Vital Signs: Ht 5\' 4"  (1.626 m)   Wt 166 lb (75.3 kg)   BMI 28.49 kg/m   Specialty Comments:  No specialty comments available.  PMFS History: Patient Active Problem List   Diagnosis Date Noted  . Trigger thumb, left thumb 02/02/2017  . Bilateral primary osteoarthritis of knee 02/02/2017  . Varicose veins of left lower extremity with complications 19/14/7829   Past Medical History:  Diagnosis Date  . Glaucoma   . Hyperlipidemia   . Hypertension     Family History  Problem Relation Age of Onset  . Colon cancer Unknown     Past Surgical History:  Procedure Laterality Date  . BUNIONECTOMY Bilateral   . CARPAL TUNNEL RELEASE    .  ENDOVENOUS ABLATION SAPHENOUS VEIN W/ LASER Left 08/30/2016   Endovenous laser ablation L GSV by Dr. Tinnie Gens  . REFRACTIVE SURGERY    . stab phlebectomy Left 01/03/2017   stab phlebectomy 10-20 incisions left leg by Mariella Saa MD   . TONSILLECTOMY    . VARICOSE VEIN SURGERY     Social History   Occupational History  . Not on file  Tobacco Use  . Smoking status: Never Smoker  . Smokeless tobacco: Never Used  Substance and Sexual Activity  . Alcohol use: Yes    Alcohol/week: 14.0 standard drinks    Types: 14 Glasses of wine per week  .  Drug use: Not on file  . Sexual activity: Not on file

## 2018-03-28 ENCOUNTER — Ambulatory Visit: Payer: Medicare Other | Admitting: Podiatry

## 2018-03-28 ENCOUNTER — Encounter: Payer: Self-pay | Admitting: Podiatry

## 2018-03-28 DIAGNOSIS — M79674 Pain in right toe(s): Secondary | ICD-10-CM | POA: Diagnosis not present

## 2018-03-28 DIAGNOSIS — B351 Tinea unguium: Secondary | ICD-10-CM

## 2018-03-28 DIAGNOSIS — L84 Corns and callosities: Secondary | ICD-10-CM

## 2018-03-28 DIAGNOSIS — M79675 Pain in left toe(s): Secondary | ICD-10-CM | POA: Diagnosis not present

## 2018-03-28 NOTE — Progress Notes (Signed)
Subjective:   Patient ID: Sheena Davis, female   DOB: 77 y.o.   MRN: [id number]   HPI Patient presents with painful nail disease 1-5 both feet that are thick yellow and she cannot cut with lesion sub-first metatarsal right that is been painful   ROS      Objective:  Physical Exam  Neurovascular status unchanged with thick yellow brittle nailbeds 1-5 both feet that are painful in the corners and on top along with keratotic lesion sub-first metatarsal head right that is painful     Assessment:  Mycotic nail infection 1-5 both feet with lesion formation sub-first metatarsal right     Plan:  Debrided nailbeds 1-5 both feet with no iatrogenic bleeding and lesion right with no iatrogenic bleeding and reappoint for routine care

## 2018-04-30 ENCOUNTER — Ambulatory Visit (INDEPENDENT_AMBULATORY_CARE_PROVIDER_SITE_OTHER): Payer: Medicare Other | Admitting: Orthopedic Surgery

## 2018-05-07 ENCOUNTER — Ambulatory Visit (INDEPENDENT_AMBULATORY_CARE_PROVIDER_SITE_OTHER): Payer: Medicare Other | Admitting: Orthopedic Surgery

## 2018-06-05 ENCOUNTER — Ambulatory Visit (INDEPENDENT_AMBULATORY_CARE_PROVIDER_SITE_OTHER): Payer: Medicare Other | Admitting: Orthopedic Surgery

## 2018-06-05 ENCOUNTER — Ambulatory Visit (INDEPENDENT_AMBULATORY_CARE_PROVIDER_SITE_OTHER): Payer: Medicare Other | Admitting: Physician Assistant

## 2018-06-05 ENCOUNTER — Encounter (INDEPENDENT_AMBULATORY_CARE_PROVIDER_SITE_OTHER): Payer: Self-pay | Admitting: Orthopedic Surgery

## 2018-06-05 VITALS — Ht 64.0 in | Wt 166.0 lb

## 2018-06-05 DIAGNOSIS — M170 Bilateral primary osteoarthritis of knee: Secondary | ICD-10-CM | POA: Diagnosis not present

## 2018-06-08 ENCOUNTER — Encounter (INDEPENDENT_AMBULATORY_CARE_PROVIDER_SITE_OTHER): Payer: Self-pay | Admitting: Physician Assistant

## 2018-06-08 NOTE — Progress Notes (Signed)
Office Visit Note   Patient: Sheena Davis           Date of Birth: 04-16-1941           MRN: [id number] Visit Date: 06/05/2018              Requested by: [name], MD [address], [place] [address] PCP: [name], MD  Chief Complaint  Patient presents with  . Left Knee - Pain      HPI: The patient is a 78 year old female who comes in requesting a left knee cortisone injection.  She previously underwent a hyaluronic acid injection for her left knee and reports that it was helpful for a couple of months but now she has had recurrent symptoms in the left knee and is feeling weak and painful for the past several weeks.  She cannot receive another hyaluronic injection until after March and requests something in the interim, a steroid injection if possible.  She reports her right knee is painful as well but not nearly as symptomatic as the left knee.  Assessment & Plan: Visit Diagnoses:  1. Bilateral primary osteoarthritis of knee     Plan: After informed consent the left knee was injected with a combination of lidocaine and Depo-Medrol under sterile techniques and the patient tolerated this well.  She will call back in March to see if she can get set up for another hyaluronic acid injection to the left knee and perhaps something on the right side if she is more symptomatic on this side at that time.  Follow-Up Instructions: Return if symptoms worsen or fail to improve.   Ortho Exam  Patient is alert, oriented, no adenopathy, well-dressed, normal affect, normal respiratory effort. She ambulates with an antalgic appearing gait.  There is a mild effusion on the left knee.  She is tender over the medial joint line.  Range of motion is 0 to 110 degrees with pain on end flexion.  There is no instability.  There is crepitus with range of motion.  Imaging: No results found. No images are attached to the encounter.  Labs: No results found for: HGBA1C,  ESRSEDRATE, CRP, LABURIC, REPTSTATUS, GRAMSTAIN, CULT, LABORGA   No results found for: ALBUMIN, PREALBUMIN, LABURIC  Body mass index is 28.49 kg/m.  Orders:  No orders of the defined types were placed in this encounter.  No orders of the defined types were placed in this encounter.    Procedures: No procedures performed  Clinical Data: No additional findings.  ROS:  All other systems negative, except as noted in the HPI. Review of Systems  Objective: Vital Signs: Ht 5\' 4"  (1.626 m)   Wt 166 lb (75.3 kg)   BMI 28.49 kg/m   Specialty Comments:  No specialty comments available.  PMFS History: Patient Active Problem List   Diagnosis Date Noted  . Trigger thumb, left thumb 02/02/2017  . Bilateral primary osteoarthritis of knee 02/02/2017  . Varicose veins of left lower extremity with complications 86/57/8469   Past Medical History:  Diagnosis Date  . Glaucoma   . Hyperlipidemia   . Hypertension     Family History  Problem Relation Age of Onset  . Colon cancer Unknown     Past Surgical History:  Procedure Laterality Date  . BUNIONECTOMY Bilateral   . CARPAL TUNNEL RELEASE    . ENDOVENOUS ABLATION SAPHENOUS VEIN W/ LASER Left 08/30/2016   Endovenous laser ablation L GSV by Dr. Tinnie Gens  .  REFRACTIVE SURGERY    . stab phlebectomy Left 01/03/2017   stab phlebectomy 10-20 incisions left leg by Mariella Saa MD   . TONSILLECTOMY    . VARICOSE VEIN SURGERY     Social History   Occupational History  . Not on file  Tobacco Use  . Smoking status: Never Smoker  . Smokeless tobacco: Never Used  Substance and Sexual Activity  . Alcohol use: Yes    Alcohol/week: 14.0 standard drinks    Types: 14 Glasses of wine per week  . Drug use: Not on file  . Sexual activity: Not on file

## 2018-06-27 ENCOUNTER — Ambulatory Visit: Payer: Medicare Other | Admitting: Podiatry

## 2018-06-27 DIAGNOSIS — M79675 Pain in left toe(s): Secondary | ICD-10-CM | POA: Diagnosis not present

## 2018-06-27 DIAGNOSIS — M79674 Pain in right toe(s): Secondary | ICD-10-CM

## 2018-06-27 DIAGNOSIS — B351 Tinea unguium: Secondary | ICD-10-CM

## 2018-06-27 DIAGNOSIS — L84 Corns and callosities: Secondary | ICD-10-CM | POA: Diagnosis not present

## 2018-06-27 NOTE — Patient Instructions (Signed)

## 2018-07-02 ENCOUNTER — Encounter: Payer: Self-pay | Admitting: Podiatry

## 2018-07-02 NOTE — Progress Notes (Signed)
Subjective: Sheena Davis presents today with painful, thick toenails 1-5 b/l that she cannot cut and which interfere with daily activities.  Pain is aggravated when wearing enclosed shoe gear. Patient also complains of painful callus right foot. Pain is relieved with periodic debridement.  [name], MD is her PCP.    Current Outpatient Medications:  .  atenolol (TENORMIN) 50 MG tablet, Take 50 mg by mouth daily., Disp: , Rfl:  .  atorvastatin (LIPITOR) 20 MG tablet, Take 20 mg by mouth at bedtime., Disp: , Rfl:  .  CALCIUM PO, Take 1 tablet by mouth daily., Disp: , Rfl:  .  carteolol (OCUPRESS) 1 % ophthalmic solution, 1 drop 2 (two) times daily., Disp: , Rfl:  .  chlorthalidone (HYGROTON) 25 MG tablet, Take 12.5 mg by mouth daily., Disp: , Rfl:  .  COMBIGAN 0.2-0.5 % ophthalmic solution, INSTILL 1 DROP INTO BOTH EYES TWICE A DAY, Disp: , Rfl: 99 .  dorzolamide-timolol (COSOPT) 22.3-6.8 MG/ML ophthalmic solution, 1 drop 3 (three) times daily., Disp: , Rfl:  .  Glucosamine HCl (GLUCOSAMINE PO), Take 1 tablet by mouth daily., Disp: , Rfl:  .  latanoprost (XALATAN) 0.005 % ophthalmic solution, 1 drop at bedtime., Disp: , Rfl:  .  losartan (COZAAR) 50 MG tablet, Take 50 mg by mouth daily., Disp: , Rfl:   Allergies  Allergen Reactions  . Ampicillin     REACTION: bleeding  . Aspirin     REACTION: bleeding    Objective: Vascular Examination: Capillary refill time <3 seconds x 10 digits Dorsalis pedis and posterior tibial pulses present b/l No digital hair x 10 digits Skin temperature warm to warm b/l  Dermatological Examination: Skin with normal turgor, texture and tone  Toenails 1-5 b/l thick, elongated, discolored with subungual debris with tenderness to palpation.  Hyperkeratotic lesion submet head 1 right foot with tenderness to palpation. No erythema, no edema, no drainage, no flocculence.  Musculoskeletal: Muscle strength 5/5 to all LE muscle  groups  Neurological: Sensation intact with 10 gram monofilament. Vibratory sensation intact.  Assessment: Painful mycotic toenails x 10  Painful callus submet head 1 right foot  Plan: 1. Toenails 1-5 b/l were debrided in length and girth without iatrogenic bleeding. 2. Patient to continue soft, supportive shoe gear 3. Patient to report any pedal injuries to medical professional immediately. 4. Follow up 3 months.  5. Patient/POA to call should there be a concern in the interim.

## 2018-07-24 ENCOUNTER — Ambulatory Visit (INDEPENDENT_AMBULATORY_CARE_PROVIDER_SITE_OTHER): Payer: Medicare Other | Admitting: Physician Assistant

## 2018-07-24 ENCOUNTER — Encounter (INDEPENDENT_AMBULATORY_CARE_PROVIDER_SITE_OTHER): Payer: Self-pay | Admitting: Orthopedic Surgery

## 2018-07-24 VITALS — Ht 64.0 in | Wt 166.0 lb

## 2018-07-24 DIAGNOSIS — I10 Essential (primary) hypertension: Secondary | ICD-10-CM | POA: Diagnosis not present

## 2018-07-24 DIAGNOSIS — E785 Hyperlipidemia, unspecified: Secondary | ICD-10-CM | POA: Diagnosis not present

## 2018-07-24 DIAGNOSIS — M170 Bilateral primary osteoarthritis of knee: Secondary | ICD-10-CM | POA: Diagnosis not present

## 2018-07-24 DIAGNOSIS — M25562 Pain in left knee: Secondary | ICD-10-CM | POA: Diagnosis not present

## 2018-07-24 NOTE — Progress Notes (Signed)
Office Visit Note   Patient: Sheena Davis           Date of Birth: Nov 25, 1940           MRN: [id number] Visit Date: 07/24/2018              Requested by: [name], MD [address] [place], [place] [address] PCP: [name], MD  Chief Complaint  Patient presents with  . Left Knee - Pain    S/p cortisone injection 05/26/2018      HPI: The patient is a 78 year old woman who comes in requesting a left knee cortisone injection.  She previously had a hyaluronic acid injection in the left knee but has come in twice over the past several months for cortisone injections as the hyaluronic acid has not sustained her.  She cannot receive another hyaluronic injection until after March and would like to pursue this but in the interim would like another cortisone injection.  Assessment & Plan: Visit Diagnoses:  1. Bilateral primary osteoarthritis of knee     Plan: After informed consent the patient did undergo a steroid injection to the left knee under sterile techniques and she tolerated this well.  We will resubmit for another Monovisc injection which she can have after March 30.  We did discuss that if this does not help significantly that she might need to consider total knee replacement should her overall medical status permit.  Follow-Up Instructions: Return in about 5 weeks (around 08/28/2018).   Ortho Exam  Patient is alert, oriented, no adenopathy, well-dressed, normal affect, normal respiratory effort. The left knee has a small effusion and she has joint line tenderness medially greater than laterally.  There is no instability.  She underwent steroid injection today without difficulty under sterile techniques and tolerated this well.  Imaging: No results found. No images are attached to the encounter.  Labs: No results found for: HGBA1C, ESRSEDRATE, CRP, LABURIC, REPTSTATUS, GRAMSTAIN, CULT, LABORGA   No results found for: ALBUMIN, PREALBUMIN,  LABURIC  Body mass index is 28.49 kg/m.  Orders:  Orders Placed This Encounter  Procedures  . Large Joint Inj: L knee   No orders of the defined types were placed in this encounter.    Procedures: Large Joint Inj: L knee on 07/24/2018 1:52 PM Indications: pain and diagnostic evaluation Details: 22 G 1.5 in needle, anteromedial approach  Arthrogram: No  Medications: 5 mL lidocaine 1 %; 40 mg methylPREDNISolone acetate 40 MG/ML Outcome: tolerated well, no immediate complications Procedure, treatment alternatives, risks and benefits explained, specific risks discussed. Consent was given by the patient. Immediately prior to procedure a time out was called to verify the correct patient, procedure, equipment, support staff and site/side marked as required. Patient was prepped and draped in the usual sterile fashion.      Clinical Data: No additional findings.  ROS:  All other systems negative, except as noted in the HPI. Review of Systems  Objective: Vital Signs: Ht 5\' 4"  (1.626 m)   Wt 166 lb (75.3 kg)   BMI 28.49 kg/m   Specialty Comments:  No specialty comments available.  PMFS History: Patient Active Problem List   Diagnosis Date Noted  . Trigger thumb, left thumb 02/02/2017  . Bilateral primary osteoarthritis of knee 02/02/2017  . Varicose veins of left lower extremity with complications 05/15/8249   Past Medical History:  Diagnosis Date  . Glaucoma   . Hyperlipidemia   . Hypertension  Family History  Problem Relation Age of Onset  . Colon cancer Unknown     Past Surgical History:  Procedure Laterality Date  . BUNIONECTOMY Bilateral   . CARPAL TUNNEL RELEASE    . ENDOVENOUS ABLATION SAPHENOUS VEIN W/ LASER Left 08/30/2016   Endovenous laser ablation L GSV by Dr. Tinnie Gens  . REFRACTIVE SURGERY    . stab phlebectomy Left 01/03/2017   stab phlebectomy 10-20 incisions left leg by Mariella Saa MD   . TONSILLECTOMY    . VARICOSE VEIN SURGERY      Social History   Occupational History  . Not on file  Tobacco Use  . Smoking status: Never Smoker  . Smokeless tobacco: Never Used  Substance and Sexual Activity  . Alcohol use: Yes    Alcohol/week: 14.0 standard drinks    Types: 14 Glasses of wine per week  . Drug use: Not on file  . Sexual activity: Not on file

## 2018-07-27 MED ORDER — LIDOCAINE HCL 1 % IJ SOLN
5.0000 mL | INTRAMUSCULAR | Status: AC | PRN
  Administered 2018-07-24: 5 mL

## 2018-07-27 MED ORDER — METHYLPREDNISOLONE ACETATE 40 MG/ML IJ SUSP
40.0000 mg | INTRAMUSCULAR | Status: AC | PRN
  Administered 2018-07-24: 40 mg via INTRA_ARTICULAR

## 2018-08-10 ENCOUNTER — Telehealth (INDEPENDENT_AMBULATORY_CARE_PROVIDER_SITE_OTHER): Payer: Self-pay

## 2018-08-10 NOTE — Telephone Encounter (Signed)
Submitted VOB for SynviscOne, left knee. 

## 2018-08-14 ENCOUNTER — Telehealth (INDEPENDENT_AMBULATORY_CARE_PROVIDER_SITE_OTHER): Payer: Self-pay

## 2018-08-14 NOTE — Telephone Encounter (Signed)
Patient approved for SynviscOne, left knee. [place] Covered at 100% through insurance [facility]) No Co-pay No PA required  Appt. 08/28/2018

## 2018-08-27 ENCOUNTER — Telehealth (INDEPENDENT_AMBULATORY_CARE_PROVIDER_SITE_OTHER): Payer: Self-pay

## 2018-08-27 NOTE — Telephone Encounter (Signed)
Called and lm on vm to call back to ask COVID-19 prescreen questions.

## 2018-08-28 ENCOUNTER — Ambulatory Visit (INDEPENDENT_AMBULATORY_CARE_PROVIDER_SITE_OTHER): Payer: Medicare Other | Admitting: Orthopedic Surgery

## 2018-09-03 ENCOUNTER — Telehealth (INDEPENDENT_AMBULATORY_CARE_PROVIDER_SITE_OTHER): Payer: Self-pay | Admitting: Radiology

## 2018-09-03 ENCOUNTER — Telehealth (INDEPENDENT_AMBULATORY_CARE_PROVIDER_SITE_OTHER): Payer: Self-pay

## 2018-09-03 NOTE — Telephone Encounter (Signed)
Called and lm on vm to advise that we needed to move her appt time up from tomorrow at 1:30 to 8:30 if she was able or to another day. We are having to resch appt due to the office closing at 12 started tomorrow.

## 2018-09-03 NOTE — Telephone Encounter (Signed)
Left message for patient to call back for screening questions and move up appointment on 4/14 with Dr. Sharol Given

## 2018-09-04 ENCOUNTER — Ambulatory Visit (INDEPENDENT_AMBULATORY_CARE_PROVIDER_SITE_OTHER): Payer: Medicare Other | Admitting: Physician Assistant

## 2018-09-04 ENCOUNTER — Other Ambulatory Visit: Payer: Self-pay

## 2018-09-04 ENCOUNTER — Encounter (INDEPENDENT_AMBULATORY_CARE_PROVIDER_SITE_OTHER): Payer: Self-pay | Admitting: Orthopedic Surgery

## 2018-09-04 ENCOUNTER — Ambulatory Visit (INDEPENDENT_AMBULATORY_CARE_PROVIDER_SITE_OTHER): Payer: Medicare Other | Admitting: Orthopedic Surgery

## 2018-09-04 VITALS — Ht 64.0 in | Wt 166.0 lb

## 2018-09-04 DIAGNOSIS — M1712 Unilateral primary osteoarthritis, left knee: Secondary | ICD-10-CM

## 2018-09-04 DIAGNOSIS — M170 Bilateral primary osteoarthritis of knee: Secondary | ICD-10-CM

## 2018-09-04 MED ORDER — HYLAN G-F 20 48 MG/6ML IX SOSY
48.0000 mg | PREFILLED_SYRINGE | INTRA_ARTICULAR | Status: AC | PRN
  Administered 2018-09-04: 48 mg via INTRA_ARTICULAR

## 2018-09-04 MED ORDER — LIDOCAINE HCL 1 % IJ SOLN
1.0000 mL | INTRAMUSCULAR | Status: AC | PRN
  Administered 2018-09-04: 09:00:00 1 mL

## 2018-09-04 NOTE — Progress Notes (Signed)
Office Visit Note   Patient: Armeda I Polakowski           Date of Birth: 1941-03-29           MRN: [id number] Visit Date: 09/04/2018              Requested by: [name], MD [address] [place], [place] [address] PCP: [name], MD  Chief Complaint  Patient presents with  . Left Knee - Follow-up    Synovisc Inj left knee [employer]      HPI: The patient is a 78 year old woman who was seen for left knee Synvisc injection.  She got a steroid injection several weeks ago and this helped temporize until she could get a another Synvisc injection to the left knee.  She reports that the pain relief lasted at least 4 to 5 months following the last Synvisc injection.  Assessment & Plan: Visit Diagnoses:  1. Bilateral primary osteoarthritis of knee     Plan: After informed consent the patient's left knee was anesthetized with 1% Xylocaine without epi and then she was injected with the Synvisc 6 mL injection under sterile techniques.  She tolerated this well.  She will follow-up in several months.  Follow-Up Instructions: Return in about 6 months (around 03/06/2019), or if symptoms worsen or fail to improve.   Ortho Exam  Patient is alert, oriented, no adenopathy, well-dressed, normal affect, normal respiratory effort. Patient has medial joint line tenderness greater than lateral over the left knee.  She has a mild effusion.  Range of motion is 0 to 115 degrees.  She has medial joint line tenderness and crepitus with range of motion.  She ambulates with an antalgic appearing gait on the left. After informed consent the patient underwent Synvisc 1 injection under sterile techniques and tolerated this well as noted above. Imaging: No results found. No images are attached to the encounter.  Labs: No results found for: HGBA1C, ESRSEDRATE, CRP, LABURIC, REPTSTATUS, GRAMSTAIN, CULT, LABORGA   No results found for: ALBUMIN, PREALBUMIN, LABURIC  Body mass index is 28.49  kg/m.  Orders:  Orders Placed This Encounter  Procedures  . Large Joint Inj   No orders of the defined types were placed in this encounter.    Procedures: Large Joint Inj on 09/04/2018 9:17 AM Indications: pain and diagnostic evaluation Details: 18 G 1.5 in needle, anteromedial approach  Arthrogram: No  Medications: 1 mL lidocaine 1 %; 48 mg Hylan 48 MG/6ML Outcome: tolerated well, no immediate complications Procedure, treatment alternatives, risks and benefits explained, specific risks discussed. Consent was given by the patient. Immediately prior to procedure a time out was called to verify the correct patient, procedure, equipment, support staff and site/side marked as required. Patient was prepped and draped in the usual sterile fashion.      Clinical Data: No additional findings.  ROS:  All other systems negative, except as noted in the HPI. Review of Systems  Objective: Vital Signs: Ht 5\' 4"  (1.626 m)   Wt 166 lb (75.3 kg)   BMI 28.49 kg/m   Specialty Comments:  No specialty comments available.  PMFS History: Patient Active Problem List   Diagnosis Date Noted  . Trigger thumb, left thumb 02/02/2017  . Bilateral primary osteoarthritis of knee 02/02/2017  . Varicose veins of left lower extremity with complications 19/14/7829   Past Medical History:  Diagnosis Date  . Glaucoma   . Hyperlipidemia   . Hypertension     Family  History  Problem Relation Age of Onset  . Colon cancer Unknown     Past Surgical History:  Procedure Laterality Date  . BUNIONECTOMY Bilateral   . CARPAL TUNNEL RELEASE    . ENDOVENOUS ABLATION SAPHENOUS VEIN W/ LASER Left 08/30/2016   Endovenous laser ablation L GSV by Dr. Tinnie Gens  . REFRACTIVE SURGERY    . stab phlebectomy Left 01/03/2017   stab phlebectomy 10-20 incisions left leg by Mariella Saa MD   . TONSILLECTOMY    . VARICOSE VEIN SURGERY     Social History   Occupational History  . Not on file  Tobacco Use   . Smoking status: Never Smoker  . Smokeless tobacco: Never Used  Substance and Sexual Activity  . Alcohol use: Yes    Alcohol/week: 14.0 standard drinks    Types: 14 Glasses of wine per week  . Drug use: Not on file  . Sexual activity: Not on file

## 2018-09-14 ENCOUNTER — Other Ambulatory Visit: Payer: Self-pay

## 2018-09-14 ENCOUNTER — Encounter: Payer: Self-pay | Admitting: Podiatry

## 2018-09-14 ENCOUNTER — Ambulatory Visit: Payer: Medicare Other | Admitting: Podiatry

## 2018-09-14 VITALS — Temp 97.5°F

## 2018-09-14 DIAGNOSIS — M79674 Pain in right toe(s): Secondary | ICD-10-CM | POA: Diagnosis not present

## 2018-09-14 DIAGNOSIS — L600 Ingrowing nail: Secondary | ICD-10-CM | POA: Diagnosis not present

## 2018-09-14 DIAGNOSIS — M79675 Pain in left toe(s): Secondary | ICD-10-CM | POA: Diagnosis not present

## 2018-09-14 DIAGNOSIS — B351 Tinea unguium: Secondary | ICD-10-CM

## 2018-09-14 DIAGNOSIS — L84 Corns and callosities: Secondary | ICD-10-CM

## 2018-09-14 MED ORDER — NEOMYCIN-POLYMYXIN-HC 3.5-10000-1 OT SOLN: OTIC | 0 refills | Status: AC

## 2018-09-14 NOTE — Progress Notes (Signed)
Subjective:   Patient ID: Sheena Davis, female   DOB: 78 y.o.   MRN: [id number]   HPI Patient presents stating she is got a chronic ingrown toenail of the left big toe that is been sore the lesion underneath the right is sore and all of her nails are incurvated and tender with palpation   ROS      Objective:  Physical Exam  Neurovascular status intact with incurvated medial border left hallux that is painful and also noted to have nail disease 1-5 both feet with thick yellow brittle debris and keratotic lesion sub-right first metatarsal that is painful     Assessment:  Ingrown toenail deformity left hallux medial border with lesion formation sub-right and nail disease 1-5 both feet     Plan:  H&P conditions reviewed at today sharp sterile debridement of all nailbeds accomplished debrided lesion underneath the right foot with no iatrogenic bleeding and for the left hallux nail I infiltrated 60 mg like Marcaine mixture sterile prep applied and using sterile instrumentation remove the medial border exposed matrix and applied phenol 3 applications 30 seconds followed by alcohol lavage sterile dressing.  Gave instructions on soaks leave dressing on 24 hours but take it off earlier if any issues were to occur and gave instructions for drop usage and to call with any questions concerns

## 2018-09-14 NOTE — Patient Instructions (Signed)

## 2018-09-26 ENCOUNTER — Ambulatory Visit: Payer: Medicare Other | Admitting: Podiatry

## 2018-10-11 ENCOUNTER — Ambulatory Visit: Payer: Medicare Other | Admitting: Podiatry

## 2018-10-12 ENCOUNTER — Encounter: Payer: Self-pay | Admitting: Podiatry

## 2018-10-12 ENCOUNTER — Ambulatory Visit: Payer: Medicare Other | Admitting: Podiatry

## 2018-10-12 ENCOUNTER — Other Ambulatory Visit: Payer: Self-pay

## 2018-10-12 VITALS — Temp 97.5°F

## 2018-10-12 DIAGNOSIS — B079 Viral wart, unspecified: Secondary | ICD-10-CM

## 2018-10-25 ENCOUNTER — Telehealth: Payer: Self-pay | Admitting: Orthopedic Surgery

## 2018-10-25 NOTE — Telephone Encounter (Signed)
Patient left a voicemail requesting Dr. Sharol Given order gel injections for her knees.

## 2018-10-25 NOTE — Telephone Encounter (Signed)
I called pt and she wanted another gel injection. I advised she just had one for the left knee 09/04/18 and that she would not be able to have once until after 03/06/19. Pt wanted an appt to be made and this has been done. Wanted to let you know this would be for the left knee. If you want to hold on to this message and if you have a file for future injections just wanted to let you know. Thanks!

## 2018-10-25 NOTE — Telephone Encounter (Signed)
Thank you.  I have noted to submit in September 2020.

## 2018-11-12 ENCOUNTER — Telehealth: Payer: Self-pay | Admitting: Orthopedic Surgery

## 2018-11-12 NOTE — Telephone Encounter (Signed)
Patient called advised she would like to get the monovisc injection in her left knee. The number to contact patient is [phone]

## 2018-11-13 NOTE — Telephone Encounter (Signed)
Patient was called and scheduled for 230pm 11/14/2018 for Cortisone inj.

## 2018-11-14 ENCOUNTER — Ambulatory Visit (INDEPENDENT_AMBULATORY_CARE_PROVIDER_SITE_OTHER): Payer: Medicare Other | Admitting: Orthopedic Surgery

## 2018-11-14 ENCOUNTER — Encounter: Payer: Self-pay | Admitting: Orthopedic Surgery

## 2018-11-14 ENCOUNTER — Other Ambulatory Visit: Payer: Self-pay

## 2018-11-14 VITALS — Ht 64.0 in | Wt 166.0 lb

## 2018-11-14 DIAGNOSIS — M1712 Unilateral primary osteoarthritis, left knee: Secondary | ICD-10-CM

## 2018-11-14 MED ORDER — METHYLPREDNISOLONE ACETATE 40 MG/ML IJ SUSP
40.0000 mg | INTRAMUSCULAR | Status: AC | PRN
  Administered 2018-11-14: 40 mg via INTRA_ARTICULAR

## 2018-11-14 MED ORDER — LIDOCAINE HCL 1 % IJ SOLN
5.0000 mL | INTRAMUSCULAR | Status: AC | PRN
  Administered 2018-11-14: 15:00:00 5 mL

## 2018-11-14 NOTE — Progress Notes (Signed)
Office Visit Note   Patient: Sheena Davis           Date of Birth: 05-28-1940           MRN: [id number] Visit Date: 11/14/2018              Requested by: [name], MD [facility] [address] [place],  [place] [address] PCP: [name], MD  Chief Complaint  Patient presents with  . Left Knee - Pain      HPI: The patient is a 78 year old woman who presents today requesting a cortisone injection to her left knee for ongoing issues with arthritic pain.  She is previously had a hyaluronic acid injection of her left knee who comes in requesting a left knee cortisone injection.  She previously had a hyaluronic acid injection in the left knee In early [place].  States that he is only last weeks to few months.  She gets less relief with cortisone than hyaluronic acid but feels she requires that in the interim. Last had HA injection in Nas[place] of this year. Has had gradual worsening of pain and mechanical symptoms over last weeks.   Assessment & Plan: Visit Diagnoses:  1. Osteoarthritis of left knee, unspecified osteoarthritis type     Plan: After informed consent the patient did undergo a steroid injection. Tolerated well  Will return for repeat injection as needed.  Follow-Up Instructions: Return if symptoms worsen or fail to improve.   Ortho Exam  Patient is alert, oriented, no adenopathy, well-dressed, normal affect, normal respiratory effort. The left knee has no effusion and she has joint line tenderness medially greater than laterally.  There is no instability.  She underwent steroid injection today without difficulty under sterile techniques and tolerated this well.  Imaging: No results found. No images are attached to the encounter.  Labs: No results found for: HGBA1C, ESRSEDRATE, CRP, LABURIC, REPTSTATUS, GRAMSTAIN, CULT, LABORGA   No results found for: ALBUMIN, PREALBUMIN, LABURIC  Body mass index is 28.49 kg/m.  Orders:  No orders of the defined types were  placed in this encounter.  No orders of the defined types were placed in this encounter.    Procedures: Large Joint Inj: L knee on 11/14/2018 2:33 PM Indications: pain Details: 18 G 1.5 in needle, anteromedial approach Medications: 5 mL lidocaine 1 %; 40 mg methylPREDNISolone acetate 40 MG/ML Consent was given by the patient.      Clinical Data: No additional findings.  ROS:  All other systems negative, except as noted in the HPI. Review of Systems  Constitutional: Negative for chills and fever.  Musculoskeletal: Positive for arthralgias. Negative for joint swelling.    Objective: Vital Signs: Ht 5\' 4"  (1.626 m)   Wt 166 lb (75.3 kg)   BMI 28.49 kg/m   Specialty Comments:  No specialty comments available.  PMFS History: Patient Active Problem List   Diagnosis Date Noted  . Trigger thumb, left thumb 02/02/2017  . Bilateral primary osteoarthritis of knee 02/02/2017  . Varicose veins of left lower extremity with complications 00/93/8182   Past Medical History:  Diagnosis Date  . Glaucoma   . Hyperlipidemia   . Hypertension     Family History  Problem Relation Age of Onset  . Colon cancer Other     Past Surgical History:  Procedure Laterality Date  . BUNIONECTOMY Bilateral   . CARPAL TUNNEL RELEASE    . ENDOVENOUS ABLATION SAPHENOUS VEIN W/ LASER Left 08/30/2016   Endovenous laser  ablation L GSV by Dr. Tinnie Gens  . REFRACTIVE SURGERY    . stab phlebectomy Left 01/03/2017   stab phlebectomy 10-20 incisions left leg by Mariella Saa MD   . TONSILLECTOMY    . VARICOSE VEIN SURGERY     Social History   Occupational History  . Not on file  Tobacco Use  . Smoking status: Never Smoker  . Smokeless tobacco: Never Used  Substance and Sexual Activity  . Alcohol use: Yes    Alcohol/week: 14.0 standard drinks    Types: 14 Glasses of wine per week  . Drug use: Not on file  . Sexual activity: Not on file

## 2018-12-05 ENCOUNTER — Encounter: Payer: Self-pay | Admitting: Family

## 2018-12-05 ENCOUNTER — Ambulatory Visit (INDEPENDENT_AMBULATORY_CARE_PROVIDER_SITE_OTHER): Payer: Medicare Other | Admitting: Family

## 2018-12-05 ENCOUNTER — Ambulatory Visit: Payer: Self-pay

## 2018-12-05 ENCOUNTER — Other Ambulatory Visit: Payer: Self-pay

## 2018-12-05 VITALS — Ht 64.0 in | Wt 166.0 lb

## 2018-12-05 DIAGNOSIS — M170 Bilateral primary osteoarthritis of knee: Secondary | ICD-10-CM

## 2018-12-05 DIAGNOSIS — M25561 Pain in right knee: Secondary | ICD-10-CM | POA: Diagnosis not present

## 2018-12-11 ENCOUNTER — Encounter: Payer: Self-pay | Admitting: Family

## 2018-12-11 DIAGNOSIS — M25561 Pain in right knee: Secondary | ICD-10-CM | POA: Diagnosis not present

## 2018-12-11 MED ORDER — LIDOCAINE HCL 1 % IJ SOLN
5.0000 mL | INTRAMUSCULAR | Status: AC | PRN
  Administered 2018-12-11: 5 mL

## 2018-12-11 MED ORDER — METHYLPREDNISOLONE ACETATE 40 MG/ML IJ SUSP
40.0000 mg | INTRAMUSCULAR | Status: AC | PRN
  Administered 2018-12-11: 40 mg via INTRA_ARTICULAR

## 2018-12-11 NOTE — Progress Notes (Signed)
Office Visit Note   Patient: Sheena Davis           Date of Birth: [birth year]           MRN: [id number] Visit Date: 12/05/2018              Requested by: [name], MD [facility] [address] [place],  [place] [address] PCP: [name], MD  Chief Complaint  Patient presents with  . Left Knee - Follow-up      HPI: The patient is a 78 year old woman who presents today complaining of right knee pain.  This is relatively new for her.  She cannot recall any injury wonders if she may have stood up "wrong."  She has been having knee pain with ambulation and pain with flexion and extension.  No relieving factors.  No swelling.  No mechanical symptoms.  Of note she is status post cortisone injection of the left knee about 3 weeks ago.  This has worked well.    Assessment & Plan: Visit Diagnoses:  1. Right knee pain, unspecified chronicity   2. Bilateral primary osteoarthritis of knee     Plan: Question meniscus tear does have overall arthritic changes to the knee.  After informed consent the patient did undergo a steroid injection of the right knee she tolerated this well.  She will follow-up in the office as needed.  Follow-Up Instructions: Return in about 4 weeks (around 01/02/2019), or if symptoms worsen or fail to improve.   Right Knee Exam   Range of Motion  The patient has normal right knee ROM.  Tests  Varus: negative Valgus: negative  Other  Swelling: mild      Patient is alert, oriented, no adenopathy, well-dressed, normal affect, normal respiratory effort. The right knee has no effusion.  She does have medial joint line tenderness.  There is no erythema.   Imaging: No results found. No images are attached to the encounter.  Labs: No results found for: HGBA1C, ESRSEDRATE, CRP, LABURIC, REPTSTATUS, GRAMSTAIN, CULT, LABORGA   No results found for: ALBUMIN, PREALBUMIN, LABURIC  Body mass index is 28.49 kg/m.  Orders:  Orders Placed This  Encounter  Procedures  . XR Knee 1-2 Views Right   No orders of the defined types were placed in this encounter.    Procedures: Large Joint Inj: R knee on 12/11/2018 8:57 AM Indications: pain Details: 18 G 1.5 in needle, anteromedial approach Medications: 5 mL lidocaine 1 %; 40 mg methylPREDNISolone acetate 40 MG/ML Consent was given by the patient.      Clinical Data: No additional findings.  ROS:  All other systems negative, except as noted in the HPI. Review of Systems  Constitutional: Negative for chills and fever.  Musculoskeletal: Positive for arthralgias. Negative for joint swelling.    Objective: Vital Signs: Ht 5\' 4"  (1.626 m)   Wt 166 lb (75.3 kg)   BMI 28.49 kg/m   Specialty Comments:  No specialty comments available.  PMFS History: Patient Active Problem List   Diagnosis Date Noted  . Trigger thumb, left thumb 02/02/2017  . Bilateral primary osteoarthritis of knee 02/02/2017  . Varicose veins of left lower extremity with complications 96/78/9381   Past Medical History:  Diagnosis Date  . Glaucoma   . Hyperlipidemia   . Hypertension     Family History  Problem Relation Age of Onset  . Colon cancer Other     Past Surgical History:  Procedure Laterality Date  . BUNIONECTOMY  Bilateral   . CARPAL TUNNEL RELEASE    . ENDOVENOUS ABLATION SAPHENOUS VEIN W/ LASER Left 08/30/2016   Endovenous laser ablation L GSV by Dr. Tinnie Gens  . REFRACTIVE SURGERY    . stab phlebectomy Left 01/03/2017   stab phlebectomy 10-20 incisions left leg by Mariella Saa MD   . TONSILLECTOMY    . VARICOSE VEIN SURGERY     Social History   Occupational History  . Not on file  Tobacco Use  . Smoking status: Never Smoker  . Smokeless tobacco: Never Used  Substance and Sexual Activity  . Alcohol use: Yes    Alcohol/week: 14.0 standard drinks    Types: 14 Glasses of wine per week  . Drug use: Not on file  . Sexual activity: Not on file

## 2018-12-21 ENCOUNTER — Ambulatory Visit (INDEPENDENT_AMBULATORY_CARE_PROVIDER_SITE_OTHER): Payer: Medicare Other | Admitting: Podiatry

## 2018-12-21 ENCOUNTER — Encounter: Payer: Self-pay | Admitting: Podiatry

## 2018-12-21 ENCOUNTER — Other Ambulatory Visit: Payer: Self-pay

## 2018-12-21 VITALS — Temp 98.3°F

## 2018-12-21 DIAGNOSIS — B351 Tinea unguium: Secondary | ICD-10-CM

## 2018-12-21 DIAGNOSIS — Q828 Other specified congenital malformations of skin: Secondary | ICD-10-CM

## 2018-12-21 DIAGNOSIS — M79674 Pain in right toe(s): Secondary | ICD-10-CM

## 2018-12-21 DIAGNOSIS — M79675 Pain in left toe(s): Secondary | ICD-10-CM

## 2018-12-21 DIAGNOSIS — M79671 Pain in right foot: Secondary | ICD-10-CM | POA: Diagnosis not present

## 2018-12-21 NOTE — Progress Notes (Signed)
Subjective: Sheena Davis presents to clinic with cc of painful mycotic toenails and "corn" right plantar foot. With plantar lesion, she relates pain when weightbearing with and without shoe gear.  This pain limits her daily activities. Pain symptoms resolve with periodic professional debridement.  Sheena Orn, MD is her PCP.   She had ingrown toenail left hallux removed in [facility].   Current Outpatient Medications:  .  atenolol (TENORMIN) 50 MG tablet, Take 50 mg by mouth daily., Disp: , Rfl:  .  atorvastatin (LIPITOR) 20 MG tablet, Take 20 mg by mouth at bedtime., Disp: , Rfl:  .  CALCIUM PO, Take 1 tablet by mouth daily., Disp: , Rfl:  .  carteolol (OCUPRESS) 1 % ophthalmic solution, 1 drop 2 (two) times daily., Disp: , Rfl:  .  chlorthalidone (HYGROTON) 25 MG tablet, Take 12.5 mg by mouth daily., Disp: , Rfl:  .  COMBIGAN 0.2-0.5 % ophthalmic solution, INSTILL 1 DROP INTO BOTH EYES TWICE A DAY, Disp: , Rfl: 99 .  dorzolamide-timolol (COSOPT) 22.3-6.8 MG/ML ophthalmic solution, 1 drop 3 (three) times daily., Disp: , Rfl:  .  Glucosamine HCl (GLUCOSAMINE PO), Take 1 tablet by mouth daily., Disp: , Rfl:  .  latanoprost (XALATAN) 0.005 % ophthalmic solution, 1 drop at bedtime., Disp: , Rfl:  .  losartan (COZAAR) 50 MG tablet, Take 50 mg by mouth daily., Disp: , Rfl:  .  neomycin-polymyxin-hydrocortisone (CORTISPORIN) OTIC solution, Apply 1-2 drops to toe after soaking twice a day, Disp: 10 mL, Rfl: 0   Allergies  Allergen Reactions  . Ampicillin     REACTION: bleeding  . Aspirin     REACTION: bleeding     Objective: Vitals:   12/21/18 1506  Temp: 98.3 F (36.8 C)    Physical Examination:  Vascular  Examination: Capillary refill time <3 seconds x 10 digits.  Palpable DP/PT pulses b/l.  Digital hair absent b/l.  No edema noted b/l.  Skin temperature gradient WNL b/l.  Dermatological Examination: Skin with normal turgor, texture and tone b/l.  No open wounds  b/l.  No interdigital macerations noted b/l.  Elongated, thick, discolored brittle toenails with subungual debris and pain on dorsal palpation of nailbeds 1-5 b/l.  Porokeratotic lesions submet head 1 right foot with tenderness to palpation. No erythema, no edema, no drainage, no flocculence.  Evidence of permanent partial nail avulsion medial border left hallux completely healed.  Musculoskeletal Examination: Muscle strength 5/5 to all muscle groups b/l.  No pain, crepitus or joint discomfort with active/passive ROM.  Neurological Examination: Sensation intact 5/5 b/l with 10 gram monofilament.  Vibratory sensation intact b/l.  Assessment: 1. Mycotic nail infection with pain 1-5 b/l 2. Painful porokeratotic lesion submet head 1 right foot  Plan: 1. Toenails 1-5 b/l were debrided in length and girth without iatrogenic laceration. 2. Porokeratosis submet head 1 right foot  pared and enucleated with sterile scalpel blade without incident. 3. Continue soft, supportive shoe gear daily. 4. Report any pedal injuries to medical professional. 5. Follow up 3 months. 6. Patient/POA to call should there be a question/concern in there interim.

## 2019-01-15 ENCOUNTER — Ambulatory Visit: Payer: Medicare Other | Admitting: Orthopedic Surgery

## 2019-01-24 ENCOUNTER — Ambulatory Visit: Payer: Medicare Other | Admitting: Orthopedic Surgery

## 2019-02-12 ENCOUNTER — Telehealth: Payer: Self-pay

## 2019-02-12 NOTE — Telephone Encounter (Signed)
Submitted VOB for SynviscOne, left knee. 

## 2019-02-18 ENCOUNTER — Telehealth: Payer: Self-pay

## 2019-02-18 NOTE — Telephone Encounter (Signed)
Approved for SynviscOne, left Knee. Buy & Bill Covered at 100% after Co-pay. Co-pay of $90.00 required No PA required  Appt.1026/2020 with Dr. Sharol Given

## 2019-03-18 ENCOUNTER — Ambulatory Visit: Payer: Medicare Other | Admitting: Orthopedic Surgery

## 2019-03-19 ENCOUNTER — Ambulatory Visit: Payer: Medicare Other | Admitting: Orthopedic Surgery

## 2019-03-25 ENCOUNTER — Ambulatory Visit: Payer: Medicare Other | Admitting: Podiatry

## 2019-03-25 ENCOUNTER — Encounter: Payer: Self-pay | Admitting: Podiatry

## 2019-03-25 ENCOUNTER — Other Ambulatory Visit: Payer: Self-pay

## 2019-03-25 DIAGNOSIS — B351 Tinea unguium: Secondary | ICD-10-CM

## 2019-03-25 DIAGNOSIS — M79671 Pain in right foot: Secondary | ICD-10-CM

## 2019-03-25 DIAGNOSIS — M79672 Pain in left foot: Secondary | ICD-10-CM | POA: Diagnosis not present

## 2019-03-25 DIAGNOSIS — M79675 Pain in left toe(s): Secondary | ICD-10-CM

## 2019-03-25 DIAGNOSIS — Q828 Other specified congenital malformations of skin: Secondary | ICD-10-CM | POA: Diagnosis not present

## 2019-03-25 DIAGNOSIS — M79674 Pain in right toe(s): Secondary | ICD-10-CM | POA: Diagnosis not present

## 2019-03-25 NOTE — Patient Instructions (Signed)

## 2019-03-26 ENCOUNTER — Ambulatory Visit: Payer: Medicare Other | Admitting: Orthopedic Surgery

## 2019-03-29 NOTE — Progress Notes (Signed)
Subjective: Sheena Davis is seen today for follow up painful plantar callus and elongated, thickened toenails 1-5 b/l feet that she cannot cut. Pain interferes with daily activities. Aggravating factor includes wearing enclosed shoe gear and relieved with periodic debridement.  Current Outpatient Medications on File Prior to Visit  Medication Sig  . atenolol (TENORMIN) 50 MG tablet Take 50 mg by mouth daily.  Marland Kitchen atorvastatin (LIPITOR) 20 MG tablet Take 20 mg by mouth at bedtime.  Marland Kitchen CALCIUM PO Take 1 tablet by mouth daily.  . carteolol (OCUPRESS) 1 % ophthalmic solution 1 drop 2 (two) times daily.  . chlorthalidone (HYGROTON) 25 MG tablet Take 12.5 mg by mouth daily.  . COMBIGAN 0.2-0.5 % ophthalmic solution INSTILL 1 DROP INTO BOTH EYES TWICE A DAY  . dorzolamide (TRUSOPT) 2 % ophthalmic solution   . dorzolamide-timolol (COSOPT) 22.3-6.8 MG/ML ophthalmic solution 1 drop 3 (three) times daily.  . Glucosamine HCl (GLUCOSAMINE PO) Take 1 tablet by mouth daily.  Marland Kitchen latanoprost (XALATAN) 0.005 % ophthalmic solution 1 drop at bedtime.  Marland Kitchen losartan (COZAAR) 50 MG tablet Take 50 mg by mouth daily.  Marland Kitchen neomycin-polymyxin-hydrocortisone (CORTISPORIN) OTIC solution Apply 1-2 drops to toe after soaking twice a day   No current facility-administered medications on file prior to visit.      Allergies  Allergen Reactions  . Ampicillin     REACTION: bleeding  . Aspirin     REACTION: bleeding    Objective:  Vascular Examination: Capillary refill time <3 seconds b/l.   Dorsalis pedis present b/l.  Posterior tibial pulses present b/l.  Digital hair absent b/l.  Skin temperature gradient WNL b/l.   Dermatological Examination: Skin with normal turgor, texture and tone b/l.  Toenails 1-5 b/l discolored, thick, dystrophic with subungual debris and pain with palpation to nailbeds due to thickness of nails.  Porokeratotic lesions submet head 1 right foot with tenderness to palpation. No erythema,  no edema, no drainage, no flocculence.  Evidence of permanent partial nail avulsion medial border left hallux.   Musculoskeletal: Muscle strength 5/5 to all LE muscle groups b/l.  No gross bony deformities b/l.  No pain, crepitus or joint limitation noted with ROM.   Neurological Examination: Protective sensation intact 5/5 with 10 gram monofilament bilaterally.  Assessment: Painful onychomycosis toenails 1-5 b/l  Painful porokeratotic lesion submet head 1 right foot  Plan: 1. Toenails 1-5 b/l were debrided in length and girth without iatrogenic bleeding.  2. Porokeratosis submet head 1 right foot pared and enucleated with sterile scalpel blade without incident. 3. Patient to continue soft, supportive shoe gear. 4. Patient to report any pedal injuries to medical professional immediately. 5. Follow up 3 months.  6. Patient/POA to call should there be a concern in the interim.

## 2019-04-02 ENCOUNTER — Ambulatory Visit: Payer: Medicare Other | Admitting: Orthopedic Surgery

## 2019-04-09 ENCOUNTER — Encounter: Payer: Self-pay | Admitting: Orthopedic Surgery

## 2019-04-09 ENCOUNTER — Ambulatory Visit: Payer: Medicare Other | Admitting: Orthopedic Surgery

## 2019-04-09 VITALS — Ht 64.0 in | Wt 166.0 lb

## 2019-04-09 DIAGNOSIS — M1712 Unilateral primary osteoarthritis, left knee: Secondary | ICD-10-CM | POA: Diagnosis not present

## 2019-04-09 MED ORDER — HYLAN G-F 20 48 MG/6ML IX SOSY
48.0000 mg | PREFILLED_SYRINGE | INTRA_ARTICULAR | Status: AC | PRN
  Administered 2019-04-09: 48 mg via INTRA_ARTICULAR

## 2019-04-09 MED ORDER — LIDOCAINE HCL 1 % IJ SOLN
1.0000 mL | INTRAMUSCULAR | Status: AC | PRN
  Administered 2019-04-09: 14:00:00 1 mL

## 2019-04-09 NOTE — Progress Notes (Signed)
Office Visit Note   Patient: Sheena Davis           Date of Birth: 01-14-1941           MRN: [id number] Visit Date: 04/09/2019              Requested by: [name], MD [facility] [address] [place],  [place] [address] PCP: [name], MD  Chief Complaint  Patient presents with  . Left Knee - Follow-up, Pain      HPI: The patient is a 78 year old woman who presents today complaining of right knee pain.  Today presents for planned Synvisc 1 injection.  She continues having mild pain and constant stiffness.  Pain with ambulation pain with flexion extension no relieving factors no swelling no mechanical symptoms she has had Depo-Medrol injections with minimal relief.  Last injection was nearly 4 months ago      Assessment & Plan: Visit Diagnoses:  1. Osteoarthritis of left knee, unspecified osteoarthritis type     Plan: Synvisc 1 injection.  Patient tolerated well.  To follow-up in the office as needed.  Discussed icing and using anti-inflammatories for the next few days.  Follow-Up Instructions: Return if symptoms worsen or fail to improve.   Right Knee Exam   Range of Motion  The patient has normal right knee ROM.  Tests  Varus: negative Valgus: negative  Other  Swelling: none      Patient is alert, oriented, no adenopathy, well-dressed, normal affect, normal respiratory effort. The right knee has no effusion.  She does have medial joint line tenderness.  There is no erythema.   Imaging: No results found. No images are attached to the encounter.  Labs: No results found for: HGBA1C, ESRSEDRATE, CRP, LABURIC, REPTSTATUS, GRAMSTAIN, CULT, LABORGA   No results found for: ALBUMIN, PREALBUMIN, LABURIC  Body mass index is 28.49 kg/m.  Orders:  No orders of the defined types were placed in this encounter.  No orders of the defined types were placed in this encounter.    Procedures: Large Joint Inj on 04/09/2019 2:28 PM Indications: pain  Details: 18 G 1.5 in needle, anteromedial approach Medications: 1 mL lidocaine 1 %; 48 mg Hylan 48 MG/6ML Consent was given by the patient.      Clinical Data: No additional findings.  ROS:  All other systems negative, except as noted in the HPI. Review of Systems  Constitutional: Negative for chills and fever.  Musculoskeletal: Positive for arthralgias. Negative for joint swelling.    Objective: Vital Signs: Ht 5\' 4"  (1.626 m)   Wt 166 lb (75.3 kg)   BMI 28.49 kg/m   Specialty Comments:  No specialty comments available.  PMFS History: Patient Active Problem List   Diagnosis Date Noted  . Trigger thumb, left thumb 02/02/2017  . Bilateral primary osteoarthritis of knee 02/02/2017  . Varicose veins of left lower extremity with complications [id number]   Past Medical History:  Diagnosis Date  . Glaucoma   . Hyperlipidemia   . Hypertension     Family History  Problem Relation Age of Onset  . Colon cancer Other     Past Surgical History:  Procedure Laterality Date  . BUNIONECTOMY Bilateral   . CARPAL TUNNEL RELEASE    . ENDOVENOUS ABLATION SAPHENOUS VEIN W/ LASER Left 08/30/2016   Endovenous laser ablation L GSV by Dr. Tinnie Gens  . REFRACTIVE SURGERY    . stab phlebectomy Left 01/03/2017   stab phlebectomy 10-20 incisions left  leg by Mariella Saa MD   . TONSILLECTOMY    . VARICOSE VEIN SURGERY     Social History   Occupational History  . Not on file  Tobacco Use  . Smoking status: Never Smoker  . Smokeless tobacco: Never Used  Substance and Sexual Activity  . Alcohol use: Yes    Alcohol/week: 14.0 standard drinks    Types: 14 Glasses of wine per week  . Drug use: Not on file  . Sexual activity: Not on file

## 2019-07-01 ENCOUNTER — Ambulatory Visit: Payer: Medicare Other | Admitting: Podiatry

## 2019-07-01 ENCOUNTER — Other Ambulatory Visit: Payer: Self-pay

## 2019-07-01 ENCOUNTER — Encounter: Payer: Self-pay | Admitting: Podiatry

## 2019-07-01 DIAGNOSIS — M79675 Pain in left toe(s): Secondary | ICD-10-CM | POA: Diagnosis not present

## 2019-07-01 DIAGNOSIS — M79674 Pain in right toe(s): Secondary | ICD-10-CM

## 2019-07-01 DIAGNOSIS — Q828 Other specified congenital malformations of skin: Secondary | ICD-10-CM

## 2019-07-01 DIAGNOSIS — B351 Tinea unguium: Secondary | ICD-10-CM

## 2019-07-01 DIAGNOSIS — M79671 Pain in right foot: Secondary | ICD-10-CM

## 2019-07-01 NOTE — Patient Instructions (Signed)

## 2019-07-04 NOTE — Progress Notes (Signed)
Subjective: Sheena Davis presents today for follow up of callus(es) right foot and painful mycotic toenails b/l that are difficult to trim. Pain interferes with ambulation. Aggravating factors include wearing enclosed shoe gear. Pain is relieved with periodic professional debridement..   Allergies  Allergen Reactions  . Ampicillin     REACTION: bleeding  . Aspirin     REACTION: bleeding     Objective: There were no vitals filed for this visit.  Vascular Examination:  Capillary fill time to digits <3s b/l, palpable DP pulses b/l, palpable PT pulses b/l, pedal hair absent b/l and skin temperature gradient within normal limits b/l  Dermatological Examination: Pedal skin with normal turgor, texture and tone bilaterally, no open wounds bilaterally, no interdigital macerations bilaterally, toenails 1-5 b/l elongated, dystrophic, thickened, crumbly with subungual debris and porokeratotic lesion(s) submet head 1 right foot. No erythema, no edema, no drainage, no flocculence  Musculoskeletal: Normal muscle strength 5/5 to all lower extremity muscle groups bilaterally, no gross bony deformities bilaterally and no pain crepitus or joint limitation noted with ROM b/l  Neurological: Protective sensation intact 5/5 intact bilaterally with 10g monofilament b/l  Assessment: Pain due to onychomycosis of toenails of both feet  Porokeratosis  Pain in right foot  Plan: -Toenails 1-5 b/l were debrided in length and girth without iatrogenic bleeding. -Painful porokeratotic lesions submet head 1 right foot pared and enucleated with sterile scalpel blade -Patient to continue soft, supportive shoe gear daily. -Patient to report any pedal injuries to medical professional immediately. -Patient/POA to call should there be question/concern in the interim.  Return in about 3 months (around 09/28/2019) for nail and callus trim.

## 2019-08-23 ENCOUNTER — Other Ambulatory Visit: Payer: Self-pay | Admitting: Internal Medicine

## 2019-08-23 DIAGNOSIS — R413 Other amnesia: Secondary | ICD-10-CM

## 2019-08-29 DIAGNOSIS — Z961 Presence of intraocular lens: Secondary | ICD-10-CM | POA: Diagnosis not present

## 2019-08-29 DIAGNOSIS — H401132 Primary open-angle glaucoma, bilateral, moderate stage: Secondary | ICD-10-CM | POA: Diagnosis not present

## 2019-09-07 DIAGNOSIS — E785 Hyperlipidemia, unspecified: Secondary | ICD-10-CM | POA: Diagnosis not present

## 2019-09-07 DIAGNOSIS — Z8249 Family history of ischemic heart disease and other diseases of the circulatory system: Secondary | ICD-10-CM | POA: Diagnosis not present

## 2019-09-07 DIAGNOSIS — H409 Unspecified glaucoma: Secondary | ICD-10-CM | POA: Diagnosis not present

## 2019-09-07 DIAGNOSIS — Z833 Family history of diabetes mellitus: Secondary | ICD-10-CM | POA: Diagnosis not present

## 2019-09-07 DIAGNOSIS — F0390 Unspecified dementia without behavioral disturbance: Secondary | ICD-10-CM | POA: Diagnosis not present

## 2019-09-07 DIAGNOSIS — Z818 Family history of other mental and behavioral disorders: Secondary | ICD-10-CM | POA: Diagnosis not present

## 2019-09-07 DIAGNOSIS — I10 Essential (primary) hypertension: Secondary | ICD-10-CM | POA: Diagnosis not present

## 2019-09-19 ENCOUNTER — Ambulatory Visit: Payer: Medicare PPO | Admitting: Internal Medicine

## 2019-09-20 DIAGNOSIS — F0390 Unspecified dementia without behavioral disturbance: Secondary | ICD-10-CM | POA: Diagnosis not present

## 2019-09-20 DIAGNOSIS — R413 Other amnesia: Secondary | ICD-10-CM | POA: Diagnosis not present

## 2019-09-26 DIAGNOSIS — E785 Hyperlipidemia, unspecified: Secondary | ICD-10-CM | POA: Diagnosis not present

## 2019-09-26 DIAGNOSIS — M170 Bilateral primary osteoarthritis of knee: Secondary | ICD-10-CM | POA: Diagnosis not present

## 2019-09-26 DIAGNOSIS — M189 Osteoarthritis of first carpometacarpal joint, unspecified: Secondary | ICD-10-CM | POA: Diagnosis not present

## 2019-09-26 DIAGNOSIS — H4089 Other specified glaucoma: Secondary | ICD-10-CM | POA: Diagnosis not present

## 2019-09-26 DIAGNOSIS — I10 Essential (primary) hypertension: Secondary | ICD-10-CM | POA: Diagnosis not present

## 2019-09-26 DIAGNOSIS — I447 Left bundle-branch block, unspecified: Secondary | ICD-10-CM | POA: Diagnosis not present

## 2019-09-26 DIAGNOSIS — F0390 Unspecified dementia without behavioral disturbance: Secondary | ICD-10-CM | POA: Diagnosis not present

## 2019-09-26 DIAGNOSIS — I872 Venous insufficiency (chronic) (peripheral): Secondary | ICD-10-CM | POA: Diagnosis not present

## 2019-09-26 DIAGNOSIS — G8929 Other chronic pain: Secondary | ICD-10-CM | POA: Diagnosis not present

## 2019-09-27 DIAGNOSIS — E785 Hyperlipidemia, unspecified: Secondary | ICD-10-CM | POA: Diagnosis not present

## 2019-09-27 DIAGNOSIS — I447 Left bundle-branch block, unspecified: Secondary | ICD-10-CM | POA: Diagnosis not present

## 2019-09-27 DIAGNOSIS — F0390 Unspecified dementia without behavioral disturbance: Secondary | ICD-10-CM | POA: Diagnosis not present

## 2019-09-27 DIAGNOSIS — H4089 Other specified glaucoma: Secondary | ICD-10-CM | POA: Diagnosis not present

## 2019-09-27 DIAGNOSIS — I10 Essential (primary) hypertension: Secondary | ICD-10-CM | POA: Diagnosis not present

## 2019-09-27 DIAGNOSIS — I872 Venous insufficiency (chronic) (peripheral): Secondary | ICD-10-CM | POA: Diagnosis not present

## 2019-09-27 DIAGNOSIS — G8929 Other chronic pain: Secondary | ICD-10-CM | POA: Diagnosis not present

## 2019-09-27 DIAGNOSIS — M189 Osteoarthritis of first carpometacarpal joint, unspecified: Secondary | ICD-10-CM | POA: Diagnosis not present

## 2019-09-27 DIAGNOSIS — M170 Bilateral primary osteoarthritis of knee: Secondary | ICD-10-CM | POA: Diagnosis not present

## 2019-10-04 DIAGNOSIS — I447 Left bundle-branch block, unspecified: Secondary | ICD-10-CM | POA: Diagnosis not present

## 2019-10-04 DIAGNOSIS — M189 Osteoarthritis of first carpometacarpal joint, unspecified: Secondary | ICD-10-CM | POA: Diagnosis not present

## 2019-10-04 DIAGNOSIS — G8929 Other chronic pain: Secondary | ICD-10-CM | POA: Diagnosis not present

## 2019-10-04 DIAGNOSIS — E785 Hyperlipidemia, unspecified: Secondary | ICD-10-CM | POA: Diagnosis not present

## 2019-10-04 DIAGNOSIS — H4089 Other specified glaucoma: Secondary | ICD-10-CM | POA: Diagnosis not present

## 2019-10-04 DIAGNOSIS — F0390 Unspecified dementia without behavioral disturbance: Secondary | ICD-10-CM | POA: Diagnosis not present

## 2019-10-04 DIAGNOSIS — I10 Essential (primary) hypertension: Secondary | ICD-10-CM | POA: Diagnosis not present

## 2019-10-04 DIAGNOSIS — M170 Bilateral primary osteoarthritis of knee: Secondary | ICD-10-CM | POA: Diagnosis not present

## 2019-10-04 DIAGNOSIS — I872 Venous insufficiency (chronic) (peripheral): Secondary | ICD-10-CM | POA: Diagnosis not present

## 2019-10-15 ENCOUNTER — Other Ambulatory Visit: Payer: Self-pay

## 2019-10-15 ENCOUNTER — Ambulatory Visit
Admission: RE | Admit: 2019-10-15 | Discharge: 2019-10-15 | Disposition: A | Discharge status: Home / Self Care | Payer: Medicare PPO | Source: Ambulatory Visit | Attending: Internal Medicine | Admitting: Internal Medicine

## 2019-10-15 DIAGNOSIS — R413 Other amnesia: Secondary | ICD-10-CM

## 2019-10-17 DIAGNOSIS — F0390 Unspecified dementia without behavioral disturbance: Secondary | ICD-10-CM | POA: Diagnosis not present

## 2019-10-17 DIAGNOSIS — I447 Left bundle-branch block, unspecified: Secondary | ICD-10-CM | POA: Diagnosis not present

## 2019-10-17 DIAGNOSIS — M189 Osteoarthritis of first carpometacarpal joint, unspecified: Secondary | ICD-10-CM | POA: Diagnosis not present

## 2019-10-17 DIAGNOSIS — E785 Hyperlipidemia, unspecified: Secondary | ICD-10-CM | POA: Diagnosis not present

## 2019-10-17 DIAGNOSIS — G8929 Other chronic pain: Secondary | ICD-10-CM | POA: Diagnosis not present

## 2019-10-17 DIAGNOSIS — I10 Essential (primary) hypertension: Secondary | ICD-10-CM | POA: Diagnosis not present

## 2019-10-17 DIAGNOSIS — H4089 Other specified glaucoma: Secondary | ICD-10-CM | POA: Diagnosis not present

## 2019-10-17 DIAGNOSIS — I872 Venous insufficiency (chronic) (peripheral): Secondary | ICD-10-CM | POA: Diagnosis not present

## 2019-10-17 DIAGNOSIS — M170 Bilateral primary osteoarthritis of knee: Secondary | ICD-10-CM | POA: Diagnosis not present

## 2019-10-24 DIAGNOSIS — Z23 Encounter for immunization: Secondary | ICD-10-CM | POA: Diagnosis not present

## 2019-10-24 DIAGNOSIS — F0390 Unspecified dementia without behavioral disturbance: Secondary | ICD-10-CM | POA: Diagnosis not present

## 2019-10-24 DIAGNOSIS — I10 Essential (primary) hypertension: Secondary | ICD-10-CM | POA: Diagnosis not present

## 2019-10-24 DIAGNOSIS — E7800 Pure hypercholesterolemia, unspecified: Secondary | ICD-10-CM | POA: Diagnosis not present

## 2019-10-25 DIAGNOSIS — I447 Left bundle-branch block, unspecified: Secondary | ICD-10-CM | POA: Diagnosis not present

## 2019-10-25 DIAGNOSIS — G8929 Other chronic pain: Secondary | ICD-10-CM | POA: Diagnosis not present

## 2019-10-25 DIAGNOSIS — H4089 Other specified glaucoma: Secondary | ICD-10-CM | POA: Diagnosis not present

## 2019-10-25 DIAGNOSIS — E785 Hyperlipidemia, unspecified: Secondary | ICD-10-CM | POA: Diagnosis not present

## 2019-10-25 DIAGNOSIS — I872 Venous insufficiency (chronic) (peripheral): Secondary | ICD-10-CM | POA: Diagnosis not present

## 2019-10-25 DIAGNOSIS — I10 Essential (primary) hypertension: Secondary | ICD-10-CM | POA: Diagnosis not present

## 2019-10-25 DIAGNOSIS — M170 Bilateral primary osteoarthritis of knee: Secondary | ICD-10-CM | POA: Diagnosis not present

## 2019-10-25 DIAGNOSIS — F0390 Unspecified dementia without behavioral disturbance: Secondary | ICD-10-CM | POA: Diagnosis not present

## 2019-10-25 DIAGNOSIS — M189 Osteoarthritis of first carpometacarpal joint, unspecified: Secondary | ICD-10-CM | POA: Diagnosis not present

## 2019-10-30 DIAGNOSIS — Z0000 Encounter for general adult medical examination without abnormal findings: Secondary | ICD-10-CM | POA: Diagnosis not present

## 2019-10-30 DIAGNOSIS — Z1389 Encounter for screening for other disorder: Secondary | ICD-10-CM | POA: Diagnosis not present

## 2019-11-07 DIAGNOSIS — H4089 Other specified glaucoma: Secondary | ICD-10-CM | POA: Diagnosis not present

## 2019-11-07 DIAGNOSIS — M189 Osteoarthritis of first carpometacarpal joint, unspecified: Secondary | ICD-10-CM | POA: Diagnosis not present

## 2019-11-07 DIAGNOSIS — I447 Left bundle-branch block, unspecified: Secondary | ICD-10-CM | POA: Diagnosis not present

## 2019-11-07 DIAGNOSIS — G8929 Other chronic pain: Secondary | ICD-10-CM | POA: Diagnosis not present

## 2019-11-07 DIAGNOSIS — M170 Bilateral primary osteoarthritis of knee: Secondary | ICD-10-CM | POA: Diagnosis not present

## 2019-11-07 DIAGNOSIS — I10 Essential (primary) hypertension: Secondary | ICD-10-CM | POA: Diagnosis not present

## 2019-11-07 DIAGNOSIS — I872 Venous insufficiency (chronic) (peripheral): Secondary | ICD-10-CM | POA: Diagnosis not present

## 2019-11-07 DIAGNOSIS — E785 Hyperlipidemia, unspecified: Secondary | ICD-10-CM | POA: Diagnosis not present

## 2019-11-07 DIAGNOSIS — F0390 Unspecified dementia without behavioral disturbance: Secondary | ICD-10-CM | POA: Diagnosis not present

## 2019-11-11 DIAGNOSIS — H401132 Primary open-angle glaucoma, bilateral, moderate stage: Secondary | ICD-10-CM | POA: Diagnosis not present

## 2019-12-04 ENCOUNTER — Other Ambulatory Visit: Payer: Self-pay

## 2019-12-04 ENCOUNTER — Ambulatory Visit: Payer: Medicare PPO | Admitting: Podiatry

## 2019-12-04 DIAGNOSIS — B351 Tinea unguium: Secondary | ICD-10-CM

## 2019-12-04 DIAGNOSIS — M79671 Pain in right foot: Secondary | ICD-10-CM | POA: Diagnosis not present

## 2019-12-04 DIAGNOSIS — M79674 Pain in right toe(s): Secondary | ICD-10-CM | POA: Diagnosis not present

## 2019-12-04 DIAGNOSIS — M79675 Pain in left toe(s): Secondary | ICD-10-CM | POA: Diagnosis not present

## 2019-12-04 DIAGNOSIS — Q828 Other specified congenital malformations of skin: Secondary | ICD-10-CM | POA: Diagnosis not present

## 2019-12-06 ENCOUNTER — Encounter: Payer: Self-pay | Admitting: Podiatry

## 2019-12-06 NOTE — Progress Notes (Signed)
Subjective:  Patient ID: Sheena Davis, female    DOB: 1941-05-03,  MRN: [id number]  Sheena Davis presents to clinic today for painful porokeratotic lesion(s) plantar aspect right foot and painful mycotic toenails that limit ambulation. Aggravating factors include weightbearing with and without shoe gear. Pain for both is relieved with periodic professional debridement.   79 y.o. female presents with the above complaint.  Daughter in law, Burman Nieves, is present during today's visit.  Review of Systems: Negative except as noted in the HPI. Past Medical History:  Diagnosis Date   Glaucoma    Hyperlipidemia    Hypertension    Past Surgical History:  Procedure Laterality Date   BUNIONECTOMY Bilateral    CARPAL TUNNEL RELEASE     ENDOVENOUS ABLATION SAPHENOUS VEIN W/ LASER Left 08/30/2016   Endovenous laser ablation L GSV by Dr. Tinnie Gens   REFRACTIVE SURGERY     stab phlebectomy Left 01/03/2017   stab phlebectomy 10-20 incisions left leg by Mariella Saa MD    TONSILLECTOMY     VARICOSE VEIN SURGERY      Current Outpatient Medications:    atenolol (TENORMIN) 50 MG tablet, Take 50 mg by mouth daily., Disp: , Rfl:    atorvastatin (LIPITOR) 20 MG tablet, Take 20 mg by mouth at bedtime., Disp: , Rfl:    CALCIUM PO, Take 1 tablet by mouth daily., Disp: , Rfl:    carteolol (OCUPRESS) 1 % ophthalmic solution, 1 drop 2 (two) times daily., Disp: , Rfl:    chlorthalidone (HYGROTON) 25 MG tablet, Take 12.5 mg by mouth daily., Disp: , Rfl:    citalopram (CELEXA) 10 MG tablet, , Disp: , Rfl:    COMBIGAN 0.2-0.5 % ophthalmic solution, INSTILL 1 DROP INTO BOTH EYES TWICE A DAY, Disp: , Rfl: 99   donepezil (ARICEPT) 10 MG tablet, , Disp: , Rfl:    dorzolamide (TRUSOPT) 2 % ophthalmic solution, , Disp: , Rfl:    dorzolamide-timolol (COSOPT) 22.3-6.8 MG/ML ophthalmic solution, 1 drop 3 (three) times daily., Disp: , Rfl:    Glucosamine HCl (GLUCOSAMINE PO), Take 1 tablet by mouth  daily., Disp: , Rfl:    latanoprost (XALATAN) 0.005 % ophthalmic solution, 1 drop at bedtime., Disp: , Rfl:    losartan (COZAAR) 100 MG tablet, , Disp: , Rfl:    losartan (COZAAR) 50 MG tablet, Take 50 mg by mouth daily., Disp: , Rfl:    neomycin-polymyxin b-dexamethasone (MAXITROL) 3.5-10000-0.1 OINT, , Disp: , Rfl:    neomycin-polymyxin-hydrocortisone (CORTISPORIN) OTIC solution, Apply 1-2 drops to toe after soaking twice a day, Disp: 10 mL, Rfl: 0 Allergies  Allergen Reactions   Ampicillin     REACTION: bleeding   Aspirin     REACTION: bleeding   Social History   Occupational History   Not on file  Tobacco Use   Smoking status: Never Smoker   Smokeless tobacco: Never Used  Substance and Sexual Activity   Alcohol use: Yes    Alcohol/week: 14.0 standard drinks    Types: 14 Glasses of wine per week   Drug use: Not on file   Sexual activity: Not on file    Objective:   Constitutional Sheena Davis is a pleasant 79 y.o. Caucasian female, in NAD.Marland Kitchen AAO x 3.   Vascular Dorsalis pedis pulses palpable bilaterally. Posterior tibial pulses palpable bilaterally. Capillary refill normal to all digits.  No cyanosis or clubbing noted. Pedal hair growth diminished b/l.  Neurologic Normal speech. Oriented to person, place, and time. Epicritic  sensation to light touch grossly present bilaterally. Protective sensation intact 5/5 intact bilaterally with 10g monofilament b/l. Vibratory sensation intact b/l. Proprioception intact bilaterally. Clonus negative b/l.  Dermatologic Pedal skin with normal turgor, texture and tone b/l.  Toenails are discolored, thick, elongated, dystrophic with pain on palpation x 10. No open wounds. No skin lesions. Porokeratotic lesions submet head 1 right foot with tenderness to palpation. No erythema, no edema, no drainage, no flocculence.  Orthopedic: Normal muscle strength 5/5 to all lower extremity muscle groups bilaterally. No pain crepitus or  joint limitation noted with ROM b/l. No gross bony deformities bilaterally. Utilizes cane for ambulation assistance. No bony tenderness.    Radiographs: None Assessment:   1. Pain due to onychomycosis of toenails of both feet   2. Porokeratosis   3. Pain in right foot    Plan:  Patient was evaluated and treated and all questions answered.  Onychomycosis with pain -Nails palliatively debridement as below -Educated on self-care  Procedure: Nail Debridement Rationale: Pain Type of Debridement: manual, sharp debridement. Instrumentation: Nail nipper, rotary burr. Number of Nails: 10 -No new findings. No new orders. -Toenails 1-5 b/l were debrided in length and girth with sterile nail nippers and dremel without iatrogenic bleeding.  -Painful porokeratotic lesion(s) submet head 1 right foot pared and enucleated with sterile scalpel blade without incident. -Patient to report any pedal injuries to medical professional immediately. -Patient to continue soft, supportive shoe gear daily. -Patient/POA to call should there be question/concern in the interim.  Return in about 3 months (around 03/05/2020) for nail and callus trim.  Marzetta Board, DPM

## 2020-01-06 DIAGNOSIS — Z23 Encounter for immunization: Secondary | ICD-10-CM | POA: Diagnosis not present

## 2020-01-06 DIAGNOSIS — F0390 Unspecified dementia without behavioral disturbance: Secondary | ICD-10-CM | POA: Diagnosis not present

## 2020-01-06 DIAGNOSIS — I10 Essential (primary) hypertension: Secondary | ICD-10-CM | POA: Diagnosis not present

## 2020-01-13 DIAGNOSIS — H401132 Primary open-angle glaucoma, bilateral, moderate stage: Secondary | ICD-10-CM | POA: Diagnosis not present

## 2020-03-03 ENCOUNTER — Ambulatory Visit: Payer: Medicare PPO | Admitting: Podiatry

## 2020-03-04 ENCOUNTER — Encounter: Payer: Self-pay | Admitting: Podiatry

## 2020-03-04 ENCOUNTER — Ambulatory Visit: Payer: Medicare PPO | Admitting: Podiatry

## 2020-03-04 ENCOUNTER — Other Ambulatory Visit: Payer: Self-pay

## 2020-03-04 DIAGNOSIS — M79675 Pain in left toe(s): Secondary | ICD-10-CM | POA: Diagnosis not present

## 2020-03-04 DIAGNOSIS — M79674 Pain in right toe(s): Secondary | ICD-10-CM

## 2020-03-04 DIAGNOSIS — B351 Tinea unguium: Secondary | ICD-10-CM | POA: Diagnosis not present

## 2020-03-04 DIAGNOSIS — M79672 Pain in left foot: Secondary | ICD-10-CM | POA: Diagnosis not present

## 2020-03-04 DIAGNOSIS — Q828 Other specified congenital malformations of skin: Secondary | ICD-10-CM | POA: Diagnosis not present

## 2020-03-04 DIAGNOSIS — M79671 Pain in right foot: Secondary | ICD-10-CM | POA: Diagnosis not present

## 2020-03-08 NOTE — Progress Notes (Signed)
Subjective:  Patient ID: Sheena Davis, female    DOB: [birth year],  MRN: [id number]  Sheena Davis presents to clinic today for painful porokeratotic lesion(s) plantar aspect right foot and painful mycotic toenails that limit ambulation. Aggravating factors include weightbearing with and without shoe gear. Pain for both is relieved with periodic professional debridement.   79 y.o. female presents with the above complaint.  Patient has h/o dementia. Daughter in law, Burman Nieves, is present during today's visit.  Review of Systems: Negative except as noted in the HPI. Past Medical History:  Diagnosis Date  . Glaucoma   . Hyperlipidemia   . Hypertension    Past Surgical History:  Procedure Laterality Date  . BUNIONECTOMY Bilateral   . CARPAL TUNNEL RELEASE    . ENDOVENOUS ABLATION SAPHENOUS VEIN W/ LASER Left 08/30/2016   Endovenous laser ablation L GSV by Dr. Tinnie Gens  . REFRACTIVE SURGERY    . stab phlebectomy Left 01/03/2017   stab phlebectomy 10-20 incisions left leg by Mariella Saa MD   . TONSILLECTOMY    . VARICOSE VEIN SURGERY      Current Outpatient Medications:  .  atenolol (TENORMIN) 50 MG tablet, Take 50 mg by mouth daily., Disp: , Rfl:  .  atorvastatin (LIPITOR) 20 MG tablet, Take 20 mg by mouth at bedtime., Disp: , Rfl:  .  CALCIUM PO, Take 1 tablet by mouth daily., Disp: , Rfl:  .  carteolol (OCUPRESS) 1 % ophthalmic solution, 1 drop 2 (two) times daily., Disp: , Rfl:  .  chlorthalidone (HYGROTON) 25 MG tablet, Take 12.5 mg by mouth daily., Disp: , Rfl:  .  citalopram (CELEXA) 10 MG tablet, , Disp: , Rfl:  .  COMBIGAN 0.2-0.5 % ophthalmic solution, INSTILL 1 DROP INTO BOTH EYES TWICE A DAY, Disp: , Rfl: 99 .  donepezil (ARICEPT) 10 MG tablet, , Disp: , Rfl:  .  dorzolamide (TRUSOPT) 2 % ophthalmic solution, , Disp: , Rfl:  .  dorzolamide-timolol (COSOPT) 22.3-6.8 MG/ML ophthalmic solution, 1 drop 3 (three) times daily., Disp: , Rfl:  .  Glucosamine HCl (GLUCOSAMINE  PO), Take 1 tablet by mouth daily., Disp: , Rfl:  .  latanoprost (XALATAN) 0.005 % ophthalmic solution, 1 drop at bedtime., Disp: , Rfl:  .  losartan (COZAAR) 100 MG tablet, , Disp: , Rfl:  .  losartan (COZAAR) 50 MG tablet, Take 50 mg by mouth daily., Disp: , Rfl:  .  neomycin-polymyxin b-dexamethasone (MAXITROL) 3.5-10000-0.1 OINT, , Disp: , Rfl:  .  neomycin-polymyxin-hydrocortisone (CORTISPORIN) OTIC solution, Apply 1-2 drops to toe after soaking twice a day, Disp: 10 mL, Rfl: 0 Allergies  Allergen Reactions  . Ampicillin     REACTION: bleeding  . Aspirin     REACTION: bleeding   Social History   Occupational History  . Not on file  Tobacco Use  . Smoking status: Never Smoker  . Smokeless tobacco: Never Used  Substance and Sexual Activity  . Alcohol use: Yes    Alcohol/week: 14.0 standard drinks    Types: 14 Glasses of wine per week  . Drug use: Not on file  . Sexual activity: Not on file    Objective:   Constitutional Sheena Davis is a pleasant 79 y.o. Caucasian female, in NAD. AAO x 3.   Vascular Dorsalis pedis pulses palpable bilaterally. Posterior tibial pulses palpable bilaterally.Capillary refill normal to all digits. No cyanosis or clubbing noted. Pedal hair growth diminished b/l.  Neurologic Normal speech. Epicritic sensation to light touch  grossly present bilaterally. Protective sensation intact 5/5 intact bilaterally with 10g monofilament b/l. Vibratory sensation intact b/l. Proprioception intact bilaterally. Clonus negative b/l.  Dermatologic Pedal skin with normal turgor, texture and tone b/l.  Toenails are discolored, thick, elongated, dystrophic with pain on palpation x 10. No open wounds. No skin lesions. Porokeratotic lesions submet head 1 right foot with tenderness to palpation. No erythema, no edema, no drainage, no flocculence.  Orthopedic: Normal muscle strength 5/5 to all lower extremity muscle groups bilaterally. No pain crepitus or joint limitation noted  with ROM b/l. No gross bony deformities bilaterally. Utilizes cane for ambulation assistance. No bony tenderness.    Radiographs: None Assessment:   1. Pain due to onychomycosis of toenails of both feet   2. Porokeratosis   3. Pain in both feet    Plan:  Patient was evaluated and treated and all questions answered.  Onychomycosis with pain -Nails palliatively debridement as below -Educated on self-care  Procedure: Nail Debridement Rationale: Pain Type of Debridement: manual, sharp debridement. Instrumentation: Nail nipper, rotary burr. Number of Nails: 10 -No new findings. No new orders. -Toenails 1-5 b/l were debrided in length and girth with sterile nail nippers and dremel without iatrogenic bleeding.  -Painful porokeratotic lesion(s) submet head 1 right foot pared and enucleated with sterile scalpel blade without incident. -Patient to report any pedal injuries to medical professional immediately. -Patient to continue soft, supportive shoe gear daily. -Patient/POA to call should there be question/concern in the interim.  Return in about 9 weeks (around 05/06/2020).  Marzetta Board, DPM

## 2020-03-20 DIAGNOSIS — F0391 Unspecified dementia with behavioral disturbance: Secondary | ICD-10-CM | POA: Diagnosis not present

## 2020-04-22 DIAGNOSIS — H401132 Primary open-angle glaucoma, bilateral, moderate stage: Secondary | ICD-10-CM | POA: Diagnosis not present

## 2020-05-08 ENCOUNTER — Ambulatory Visit: Payer: Medicare PPO | Admitting: Podiatry

## 2020-05-12 ENCOUNTER — Ambulatory Visit: Payer: Medicare PPO | Admitting: Podiatry

## 2020-08-25 ENCOUNTER — Ambulatory Visit: Payer: Medicare PPO | Admitting: Podiatry

## 2020-10-14 ENCOUNTER — Other Ambulatory Visit: Payer: Self-pay

## 2020-10-14 ENCOUNTER — Ambulatory Visit: Payer: Medicare PPO | Admitting: Podiatry

## 2020-10-14 ENCOUNTER — Encounter: Payer: Self-pay | Admitting: Podiatry

## 2020-10-14 DIAGNOSIS — B351 Tinea unguium: Secondary | ICD-10-CM

## 2020-10-14 DIAGNOSIS — H9190 Unspecified hearing loss, unspecified ear: Secondary | ICD-10-CM | POA: Insufficient documentation

## 2020-10-14 DIAGNOSIS — I10 Essential (primary) hypertension: Secondary | ICD-10-CM | POA: Insufficient documentation

## 2020-10-14 DIAGNOSIS — R413 Other amnesia: Secondary | ICD-10-CM | POA: Insufficient documentation

## 2020-10-14 DIAGNOSIS — M79675 Pain in left toe(s): Secondary | ICD-10-CM

## 2020-10-14 DIAGNOSIS — I872 Venous insufficiency (chronic) (peripheral): Secondary | ICD-10-CM | POA: Diagnosis not present

## 2020-10-14 DIAGNOSIS — F0391 Unspecified dementia with behavioral disturbance: Secondary | ICD-10-CM

## 2020-10-14 DIAGNOSIS — E7800 Pure hypercholesterolemia, unspecified: Secondary | ICD-10-CM | POA: Insufficient documentation

## 2020-10-14 DIAGNOSIS — C4491 Basal cell carcinoma of skin, unspecified: Secondary | ICD-10-CM | POA: Insufficient documentation

## 2020-10-14 DIAGNOSIS — I5189 Other ill-defined heart diseases: Secondary | ICD-10-CM | POA: Insufficient documentation

## 2020-10-14 DIAGNOSIS — M179 Osteoarthritis of knee, unspecified: Secondary | ICD-10-CM | POA: Insufficient documentation

## 2020-10-14 DIAGNOSIS — Z860100 Personal history of colon polyps, unspecified: Secondary | ICD-10-CM | POA: Insufficient documentation

## 2020-10-14 DIAGNOSIS — I493 Ventricular premature depolarization: Secondary | ICD-10-CM | POA: Insufficient documentation

## 2020-10-14 DIAGNOSIS — K5900 Constipation, unspecified: Secondary | ICD-10-CM | POA: Insufficient documentation

## 2020-10-14 DIAGNOSIS — I447 Left bundle-branch block, unspecified: Secondary | ICD-10-CM | POA: Insufficient documentation

## 2020-10-14 DIAGNOSIS — G5600 Carpal tunnel syndrome, unspecified upper limb: Secondary | ICD-10-CM | POA: Insufficient documentation

## 2020-10-14 DIAGNOSIS — M1710 Unilateral primary osteoarthritis, unspecified knee: Secondary | ICD-10-CM | POA: Insufficient documentation

## 2020-10-14 DIAGNOSIS — M79674 Pain in right toe(s): Secondary | ICD-10-CM

## 2020-10-14 DIAGNOSIS — G8929 Other chronic pain: Secondary | ICD-10-CM | POA: Insufficient documentation

## 2020-10-14 DIAGNOSIS — H409 Unspecified glaucoma: Secondary | ICD-10-CM | POA: Insufficient documentation

## 2020-10-14 DIAGNOSIS — D126 Benign neoplasm of colon, unspecified: Secondary | ICD-10-CM | POA: Insufficient documentation

## 2020-10-14 DIAGNOSIS — F03918 Unspecified dementia, unspecified severity, with other behavioral disturbance: Secondary | ICD-10-CM | POA: Insufficient documentation

## 2020-10-14 NOTE — Progress Notes (Signed)
This patient returns to my office for at risk foot care.  This patient requires this care by a professional since this patient will be at risk due to having venous insufficiency and alxheimer.  She presents to the office with  Chris. This patient is unable to cut nails herself since the patient cannot reach her nails.These nails are painful walking and wearing shoes.  This patient presents for at risk foot care today.  General Appearance  Alert, conversant and in no acute stress.  Vascular  Dorsalis pedis and posterior tibial  pulses are palpable  bilaterally.  Capillary return is within normal limits  bilaterally. Temperature is within normal limits  bilaterally.  Neurologic  Senn-Weinstein monofilament wire test within normal limits  bilaterally. Muscle power within normal limits bilaterally.  Nails Thick disfigured discolored nails with subungual debris  from hallux to fifth toes bilaterally. No evidence of bacterial infection or drainage bilaterally.  Orthopedic  No limitations of motion  feet .  No crepitus or effusions noted.  No bony pathology or digital deformities noted.  Skin  normotropic skin with no porokeratosis noted bilaterally.  No signs of infections or ulcers noted.     Onychomycosis  Pain in right toes  Pain in left toes  Consent was obtained for treatment procedures.   Mechanical debridement of nails 1-5  bilaterally performed with a nail nipper.  Filed with dremel without incident.    Return office visit   6 months                   Told patient to return for periodic foot care and evaluation due to potential at risk complications.   Sheena Davis DPM  

## 2020-10-21 DIAGNOSIS — H401132 Primary open-angle glaucoma, bilateral, moderate stage: Secondary | ICD-10-CM | POA: Diagnosis not present

## 2021-04-19 ENCOUNTER — Ambulatory Visit (INDEPENDENT_AMBULATORY_CARE_PROVIDER_SITE_OTHER): Payer: Medicare PPO | Admitting: Podiatry

## 2021-04-19 ENCOUNTER — Other Ambulatory Visit: Payer: Self-pay

## 2021-04-19 ENCOUNTER — Encounter: Payer: Self-pay | Admitting: Podiatry

## 2021-04-19 DIAGNOSIS — M79674 Pain in right toe(s): Secondary | ICD-10-CM | POA: Diagnosis not present

## 2021-04-19 DIAGNOSIS — M79675 Pain in left toe(s): Secondary | ICD-10-CM | POA: Diagnosis not present

## 2021-04-19 DIAGNOSIS — I872 Venous insufficiency (chronic) (peripheral): Secondary | ICD-10-CM

## 2021-04-19 DIAGNOSIS — B351 Tinea unguium: Secondary | ICD-10-CM | POA: Diagnosis not present

## 2021-04-19 NOTE — Progress Notes (Signed)
This patient returns to my office for at risk foot care.  This patient requires this care by a professional since this patient will be at risk due to having venous insufficiency and alxheimer.  She presents to the office with  Chris. This patient is unable to cut nails herself since the patient cannot reach her nails.These nails are painful walking and wearing shoes.  This patient presents for at risk foot care today.  General Appearance  Alert, conversant and in no acute stress.  Vascular  Dorsalis pedis and posterior tibial  pulses are palpable  bilaterally.  Capillary return is within normal limits  bilaterally. Temperature is within normal limits  bilaterally.  Neurologic  Senn-Weinstein monofilament wire test within normal limits  bilaterally. Muscle power within normal limits bilaterally.  Nails Thick disfigured discolored nails with subungual debris  from hallux to fifth toes bilaterally. No evidence of bacterial infection or drainage bilaterally.  Orthopedic  No limitations of motion  feet .  No crepitus or effusions noted.  No bony pathology or digital deformities noted.  Skin  normotropic skin with no porokeratosis noted bilaterally.  No signs of infections or ulcers noted.     Onychomycosis  Pain in right toes  Pain in left toes  Consent was obtained for treatment procedures.   Mechanical debridement of nails 1-5  bilaterally performed with a nail nipper.  Filed with dremel without incident.    Return office visit   6 months                   Told patient to return for periodic foot care and evaluation due to potential at risk complications.   Chayson Charters DPM  

## 2021-04-22 DIAGNOSIS — H401132 Primary open-angle glaucoma, bilateral, moderate stage: Secondary | ICD-10-CM | POA: Diagnosis not present

## 2021-07-21 ENCOUNTER — Ambulatory Visit: Payer: Medicare PPO | Admitting: Podiatry

## 2021-11-26 DIAGNOSIS — H401132 Primary open-angle glaucoma, bilateral, moderate stage: Secondary | ICD-10-CM | POA: Diagnosis not present

## 2021-11-26 DIAGNOSIS — Z961 Presence of intraocular lens: Secondary | ICD-10-CM | POA: Diagnosis not present

## 2022-04-12 ENCOUNTER — Ambulatory Visit: Payer: Medicare PPO | Admitting: Podiatry

## 2022-04-12 ENCOUNTER — Encounter: Payer: Self-pay | Admitting: Podiatry

## 2022-04-12 DIAGNOSIS — B351 Tinea unguium: Secondary | ICD-10-CM

## 2022-04-12 DIAGNOSIS — I872 Venous insufficiency (chronic) (peripheral): Secondary | ICD-10-CM

## 2022-04-12 DIAGNOSIS — M79674 Pain in right toe(s): Secondary | ICD-10-CM

## 2022-04-12 DIAGNOSIS — M79675 Pain in left toe(s): Secondary | ICD-10-CM

## 2022-04-12 NOTE — Progress Notes (Signed)
This patient returns to my office for at risk foot care.  This patient requires this care by a professional since this patient will be at risk due to having venous insufficiency and alxheimer.  She presents to the office with  Gerald Stabs. This patient is unable to cut nails herself since the patient cannot reach her nails.These nails are painful walking and wearing shoes.  This patient presents for at risk foot care today.  General Appearance  Alert, conversant and in no acute stress.  Vascular  Dorsalis pedis and posterior tibial  pulses are palpable  bilaterally.  Capillary return is within normal limits  bilaterally. Temperature is within normal limits  bilaterally.  Neurologic  Senn-Weinstein monofilament wire test within normal limits  bilaterally. Muscle power within normal limits bilaterally.  Nails Thick disfigured discolored nails with subungual debris  from hallux to fifth toes bilaterally. No evidence of bacterial infection or drainage bilaterally.  Orthopedic  No limitations of motion  feet .  No crepitus or effusions noted.  No bony pathology or digital deformities noted.  Skin  normotropic skin with no porokeratosis noted bilaterally.  No signs of infections or ulcers noted.     Onychomycosis  Pain in right toes  Pain in left toes  Consent was obtained for treatment procedures.   Mechanical debridement of nails 1-5  bilaterally performed with a nail nipper.  Filed with dremel without incident.    Return office visit   6 months                   Told patient to return for periodic foot care and evaluation due to potential at risk complications.   Gardiner Barefoot DPM

## 2022-10-11 ENCOUNTER — Ambulatory Visit: Payer: Medicare PPO | Admitting: Podiatry

## 2022-10-11 ENCOUNTER — Encounter: Payer: Self-pay | Admitting: Podiatry

## 2022-10-11 DIAGNOSIS — B351 Tinea unguium: Secondary | ICD-10-CM

## 2022-10-11 DIAGNOSIS — M79675 Pain in left toe(s): Secondary | ICD-10-CM | POA: Diagnosis not present

## 2022-10-11 DIAGNOSIS — M79674 Pain in right toe(s): Secondary | ICD-10-CM | POA: Diagnosis not present

## 2022-10-11 DIAGNOSIS — I872 Venous insufficiency (chronic) (peripheral): Secondary | ICD-10-CM

## 2022-10-11 NOTE — Progress Notes (Signed)
This patient returns to my office for at risk foot care.  This patient requires this care by a professional since this patient will be at risk due to having venous insufficiency and alxheimer.  She presents to the office with  Chris. This patient is unable to cut nails herself since the patient cannot reach her nails.These nails are painful walking and wearing shoes.  This patient presents for at risk foot care today.  General Appearance  Alert, conversant and in no acute stress.  Vascular  Dorsalis pedis and posterior tibial  pulses are palpable  bilaterally.  Capillary return is within normal limits  bilaterally. Temperature is within normal limits  bilaterally.  Neurologic  Senn-Weinstein monofilament wire test within normal limits  bilaterally. Muscle power within normal limits bilaterally.  Nails Thick disfigured discolored nails with subungual debris  from hallux to fifth toes bilaterally. No evidence of bacterial infection or drainage bilaterally.  Orthopedic  No limitations of motion  feet .  No crepitus or effusions noted.  No bony pathology or digital deformities noted.  Skin  normotropic skin with no porokeratosis noted bilaterally.  No signs of infections or ulcers noted.     Onychomycosis  Pain in right toes  Pain in left toes  Consent was obtained for treatment procedures.   Mechanical debridement of nails 1-5  bilaterally performed with a nail nipper.  Filed with dremel without incident.    Return office visit   6 months                   Told patient to return for periodic foot care and evaluation due to potential at risk complications.   Alcee Sipos DPM  

## 2023-01-06 NOTE — Progress Notes (Signed)
treated

## 2023-04-13 ENCOUNTER — Ambulatory Visit: Payer: Medicare PPO | Admitting: Podiatry

## 2023-04-27 ENCOUNTER — Ambulatory Visit: Payer: Medicare PPO | Admitting: Podiatry

## 2023-05-09 ENCOUNTER — Ambulatory Visit (INDEPENDENT_AMBULATORY_CARE_PROVIDER_SITE_OTHER): Payer: Medicare PPO | Admitting: Podiatry

## 2023-05-09 ENCOUNTER — Encounter: Payer: Self-pay | Admitting: Podiatry

## 2023-05-09 DIAGNOSIS — I872 Venous insufficiency (chronic) (peripheral): Secondary | ICD-10-CM

## 2023-05-09 DIAGNOSIS — B351 Tinea unguium: Secondary | ICD-10-CM

## 2023-05-09 DIAGNOSIS — M79675 Pain in left toe(s): Secondary | ICD-10-CM

## 2023-05-09 DIAGNOSIS — M79674 Pain in right toe(s): Secondary | ICD-10-CM

## 2023-05-09 NOTE — Progress Notes (Signed)
This patient returns to my office for at risk foot care.  This patient requires this care by a professional since this patient will be at risk due to having venous insufficiency and alxheimer.  She presents to the office with  Gerald Stabs. This patient is unable to cut nails herself since the patient cannot reach her nails.These nails are painful walking and wearing shoes.  This patient presents for at risk foot care today.  General Appearance  Alert, conversant and in no acute stress.  Vascular  Dorsalis pedis and posterior tibial  pulses are palpable  bilaterally.  Capillary return is within normal limits  bilaterally. Temperature is within normal limits  bilaterally.  Neurologic  Senn-Weinstein monofilament wire test within normal limits  bilaterally. Muscle power within normal limits bilaterally.  Nails Thick disfigured discolored nails with subungual debris  from hallux to fifth toes bilaterally. No evidence of bacterial infection or drainage bilaterally.  Orthopedic  No limitations of motion  feet .  No crepitus or effusions noted.  No bony pathology or digital deformities noted.  Skin  normotropic skin with no porokeratosis noted bilaterally.  No signs of infections or ulcers noted.     Onychomycosis  Pain in right toes  Pain in left toes  Consent was obtained for treatment procedures.   Mechanical debridement of nails 1-5  bilaterally performed with a nail nipper.  Filed with dremel without incident.    Return office visit   6 months                   Told patient to return for periodic foot care and evaluation due to potential at risk complications.   Gardiner Barefoot DPM

## 2023-11-07 ENCOUNTER — Encounter: Payer: Self-pay | Admitting: Podiatry

## 2023-11-07 ENCOUNTER — Ambulatory Visit: Payer: Medicare PPO | Admitting: Podiatry

## 2023-11-07 DIAGNOSIS — B351 Tinea unguium: Secondary | ICD-10-CM

## 2023-11-07 DIAGNOSIS — I872 Venous insufficiency (chronic) (peripheral): Secondary | ICD-10-CM

## 2023-11-07 DIAGNOSIS — M79674 Pain in right toe(s): Secondary | ICD-10-CM | POA: Diagnosis not present

## 2023-11-07 DIAGNOSIS — M79675 Pain in left toe(s): Secondary | ICD-10-CM | POA: Diagnosis not present

## 2023-11-07 NOTE — Progress Notes (Signed)
This patient returns to my office for at risk foot care.  This patient requires this care by a professional since this patient will be at risk due to having venous insufficiency and alxheimer.  She presents to the office with  Gerald Stabs. This patient is unable to cut nails herself since the patient cannot reach her nails.These nails are painful walking and wearing shoes.  This patient presents for at risk foot care today.  General Appearance  Alert, conversant and in no acute stress.  Vascular  Dorsalis pedis and posterior tibial  pulses are palpable  bilaterally.  Capillary return is within normal limits  bilaterally. Temperature is within normal limits  bilaterally.  Neurologic  Senn-Weinstein monofilament wire test within normal limits  bilaterally. Muscle power within normal limits bilaterally.  Nails Thick disfigured discolored nails with subungual debris  from hallux to fifth toes bilaterally. No evidence of bacterial infection or drainage bilaterally.  Orthopedic  No limitations of motion  feet .  No crepitus or effusions noted.  No bony pathology or digital deformities noted.  Skin  normotropic skin with no porokeratosis noted bilaterally.  No signs of infections or ulcers noted.     Onychomycosis  Pain in right toes  Pain in left toes  Consent was obtained for treatment procedures.   Mechanical debridement of nails 1-5  bilaterally performed with a nail nipper.  Filed with dremel without incident.    Return office visit   6 months                   Told patient to return for periodic foot care and evaluation due to potential at risk complications.   Gardiner Barefoot DPM

## 2024-04-17 DIAGNOSIS — Z79899 Other long term (current) drug therapy: Secondary | ICD-10-CM | POA: Diagnosis not present

## 2024-04-17 DIAGNOSIS — R159 Full incontinence of feces: Secondary | ICD-10-CM | POA: Diagnosis not present

## 2024-04-17 DIAGNOSIS — I10 Essential (primary) hypertension: Secondary | ICD-10-CM | POA: Diagnosis not present

## 2024-04-17 DIAGNOSIS — R32 Unspecified urinary incontinence: Secondary | ICD-10-CM | POA: Diagnosis not present

## 2024-04-17 DIAGNOSIS — F0280 Dementia in other diseases classified elsewhere without behavioral disturbance: Secondary | ICD-10-CM | POA: Diagnosis not present

## 2024-04-17 DIAGNOSIS — E7800 Pure hypercholesterolemia, unspecified: Secondary | ICD-10-CM | POA: Diagnosis not present

## 2024-04-17 DIAGNOSIS — G309 Alzheimer's disease, unspecified: Secondary | ICD-10-CM | POA: Diagnosis not present

## 2024-04-17 DIAGNOSIS — I519 Heart disease, unspecified: Secondary | ICD-10-CM | POA: Diagnosis not present

## 2024-05-08 ENCOUNTER — Encounter: Payer: Self-pay | Admitting: Podiatry

## 2024-05-08 ENCOUNTER — Ambulatory Visit: Admitting: Podiatry

## 2024-05-08 DIAGNOSIS — M79674 Pain in right toe(s): Secondary | ICD-10-CM | POA: Diagnosis not present

## 2024-05-08 DIAGNOSIS — B351 Tinea unguium: Secondary | ICD-10-CM

## 2024-05-08 DIAGNOSIS — M79675 Pain in left toe(s): Secondary | ICD-10-CM

## 2024-05-08 DIAGNOSIS — I872 Venous insufficiency (chronic) (peripheral): Secondary | ICD-10-CM | POA: Diagnosis not present

## 2024-05-08 NOTE — Progress Notes (Signed)
This patient returns to my office for at risk foot care.  This patient requires this care by a professional since this patient will be at risk due to having venous insufficiency and alxheimer.  She presents to the office with  Gerald Stabs. This patient is unable to cut nails herself since the patient cannot reach her nails.These nails are painful walking and wearing shoes.  This patient presents for at risk foot care today.  General Appearance  Alert, conversant and in no acute stress.  Vascular  Dorsalis pedis and posterior tibial  pulses are palpable  bilaterally.  Capillary return is within normal limits  bilaterally. Temperature is within normal limits  bilaterally.  Neurologic  Senn-Weinstein monofilament wire test within normal limits  bilaterally. Muscle power within normal limits bilaterally.  Nails Thick disfigured discolored nails with subungual debris  from hallux to fifth toes bilaterally. No evidence of bacterial infection or drainage bilaterally.  Orthopedic  No limitations of motion  feet .  No crepitus or effusions noted.  No bony pathology or digital deformities noted.  Skin  normotropic skin with no porokeratosis noted bilaterally.  No signs of infections or ulcers noted.     Onychomycosis  Pain in right toes  Pain in left toes  Consent was obtained for treatment procedures.   Mechanical debridement of nails 1-5  bilaterally performed with a nail nipper.  Filed with dremel without incident.    Return office visit   6 months                   Told patient to return for periodic foot care and evaluation due to potential at risk complications.   Gardiner Barefoot DPM

## 2024-08-13 ENCOUNTER — Ambulatory Visit: Admitting: Podiatry
